# Patient Record
Sex: Male | Born: 1990
Health system: Southern US, Community
[De-identification: ages and names within clinical notes are randomized; demographics above are authoritative.]

## PROBLEM LIST (undated history)

## (undated) DIAGNOSIS — T7840XA Allergy, unspecified, initial encounter: Secondary | ICD-10-CM

## (undated) HISTORY — DX: Allergy, unspecified, initial encounter: T78.40XA

---
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
## 2012-01-12 IMAGING — CR DG CHEST 2V
1 series · 2 of 2 positions shown · non-contrast
Comparison: none

REASON FOR EXAM: FEVER, COUGH
COMMENTS:   May transport without cardiac monitor

[Series 1: view not recorded · 0.17mm/px · 2 of 2 slices shown]
[im 1/2]
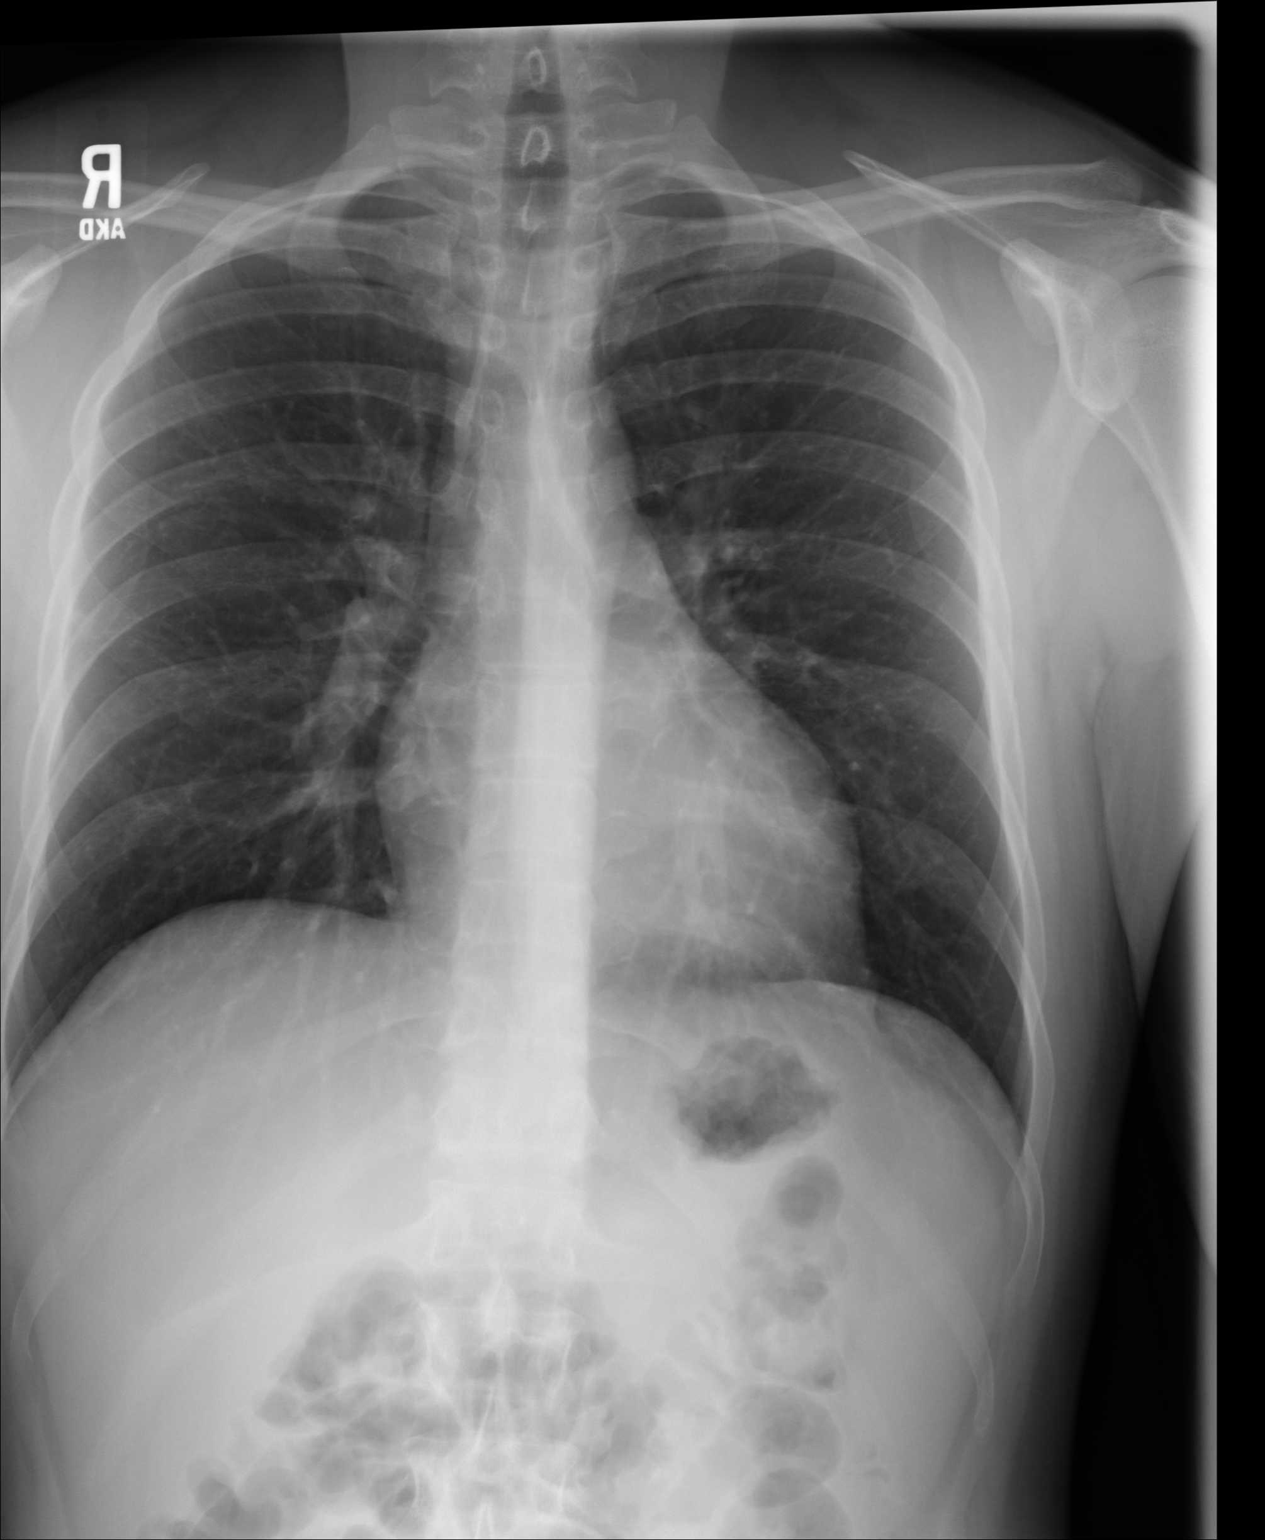
[im 2/2]
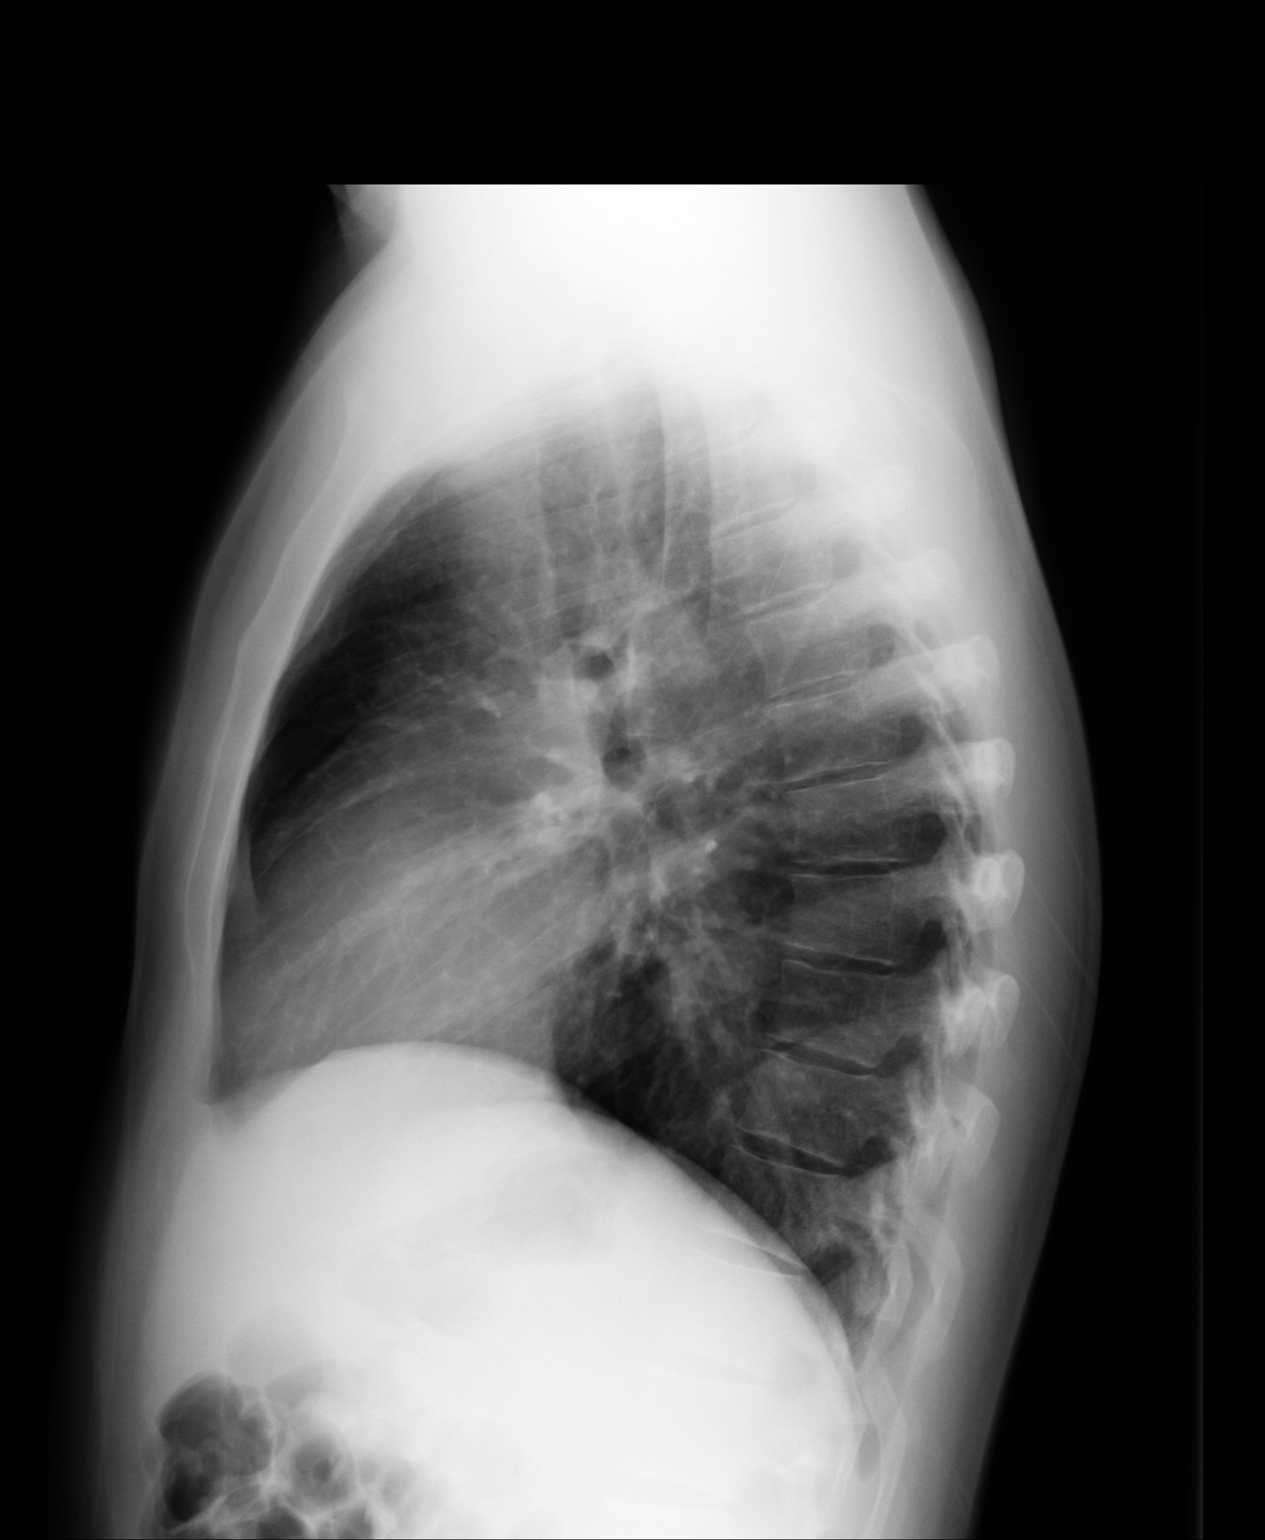

[2 of 2 positions shown; findings below may reference images not displayed]

PROCEDURE:     DXR - DXR CHEST PA (OR AP) AND LATERAL  - September 02, 2010  [DATE]

RESULT:     Comparison is made to the study 09 June, 2007.

The lungs are adequately inflated. There is no focal infiltrate. The cardiac
silhouette is normal in size. The pulmonary vascularity is not engorged.
There is no pleural effusion. The mediastinum is normal in width.
IMPRESSION: I do not see evidence of pneumonia nor other acute
cardiopulmonary abnormality.

## 2014-12-06 ENCOUNTER — Ambulatory Visit (INDEPENDENT_AMBULATORY_CARE_PROVIDER_SITE_OTHER): Payer: No Typology Code available for payment source | Admitting: Internal Medicine

## 2014-12-06 ENCOUNTER — Encounter: Payer: Self-pay | Admitting: Internal Medicine

## 2014-12-06 VITALS — BP 118/84 | HR 81 | Temp 98.8°F | Resp 18 | Ht 61.0 in | Wt 167.2 lb

## 2014-12-06 DIAGNOSIS — R0982 Postnasal drip: Secondary | ICD-10-CM

## 2014-12-06 DIAGNOSIS — J329 Chronic sinusitis, unspecified: Secondary | ICD-10-CM

## 2014-12-06 DIAGNOSIS — Z9109 Other allergy status, other than to drugs and biological substances: Secondary | ICD-10-CM

## 2014-12-06 DIAGNOSIS — R110 Nausea: Secondary | ICD-10-CM

## 2014-12-06 DIAGNOSIS — Z13220 Encounter for screening for lipoid disorders: Secondary | ICD-10-CM | POA: Diagnosis not present

## 2014-12-06 DIAGNOSIS — Z91048 Other nonmedicinal substance allergy status: Secondary | ICD-10-CM | POA: Diagnosis not present

## 2014-12-06 NOTE — Patient Instructions (Signed)
Continue the antihistamine daily.    nasacort nasal spray - two sprays each nostril one time per day.  Do this in the evening.    Saline nasal spray - flush nose at least 2-3x/day

## 2014-12-06 NOTE — Progress Notes (Signed)
Pre-visit discussion using our clinic review tool. No additional management support is needed unless otherwise documented below in the visit note.  

## 2014-12-06 NOTE — Progress Notes (Signed)
Patient ID: Derek Dillon  DOB: [birth year], 24 y.o.   MRN: [id number]   Subjective:    Patient ID: Derek Dillon, male    DOB: December 12, 1990, 24 y.o.   MRN: [id number]  HPI  Patient here to establish care.  He is accompanied by his mother.  History obtained from both of them.  He has a history of allergy issues.  Mother reports that he saw Dr Jenne Campus several years ago.  Was told to take antihistamine.  Did well with this for a while.  Over the last few months, symptoms have worsened.  Increased drainage.  Also reports runny nose.   No sinus pressure.  No chest congestion.  States the drainage is so bad that it causes some nausea and some intermittent vomiting.  No abdominal pain or cramping.  Some decreased appetite at times.  No reported weight loss.  No sob or wheezing.  No diarrhea.  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  No new exposures.     Past Medical History  Diagnosis Date  . Allergy    No past surgical history on file. Family History  Problem Relation Age of Onset  . Hypertension Mother   . Hypertension Father   . Hypertension Maternal Grandfather    Social History   Social History  . Marital Status: Single    Spouse Name: N/A  . Number of Children: N/A  . Years of Education: N/A   Social History Main Topics  . Smoking status: Never Smoker   . Smokeless tobacco: Never Used  . Alcohol Use: No  . Drug Use: No  . Sexual Activity: Not Asked   Other Topics Concern  . None   Social History Narrative    Outpatient Encounter Prescriptions as of 12/06/2014  Medication Sig  . Loratadine 10 MG CAPS Take 10 mg by mouth daily.  . Multiple Vitamin (MULTIVITAMIN) tablet Take 1 tablet by mouth daily.   No facility-administered encounter medications on file as of 12/06/2014.    Review of Systems  Constitutional: Negative for unexpected weight change.       Some intermittent decreased appetite.    HENT: Positive for postnasal drip. Negative for sinus pressure.        Increased  runny nose and increased and worsening drainage.    Eyes: Negative for pain and visual disturbance.  Respiratory: Negative for cough, chest tightness and shortness of breath.   Cardiovascular: Negative for chest pain, palpitations and leg swelling.  Gastrointestinal: Positive for nausea. Negative for vomiting, abdominal pain and diarrhea.  Genitourinary: Negative for dysuria and difficulty urinating.  Musculoskeletal: Negative for back pain and joint swelling.  Skin: Negative for color change and rash.  Neurological: Negative for dizziness, light-headedness and headaches.  Hematological: Negative for adenopathy. Does not bruise/bleed easily.  Psychiatric/Behavioral: Negative for dysphoric mood and agitation.       Objective:    Physical Exam  Constitutional: He appears well-developed and well-nourished. No distress.  HENT:  Nose: Nose normal.  Mouth/Throat: Oropharynx is clear and moist.  Eyes: Conjunctivae are normal. Right eye exhibits no discharge. Left eye exhibits no discharge.  Neck: Neck supple. No JVD present. No thyromegaly present.  Cardiovascular: Normal rate and regular rhythm.   Pulmonary/Chest: Effort normal and breath sounds normal. No respiratory distress.  Abdominal: Soft. Bowel sounds are normal. There is no tenderness.  Musculoskeletal: He exhibits no edema or tenderness.  Skin: No rash noted. No erythema.  Psychiatric: He has a normal mood and affect.  His behavior is normal.    BP 118/84 mmHg  Pulse 81  Temp(Src) 98.8 F (37.1 C) (Oral)  Resp 18  Ht 5\' 1"  (1.549 m)  Wt 167 lb 4 oz (75.864 kg)  BMI 31.62 kg/m2  SpO2 98% Wt Readings from Last 3 Encounters:  12/06/14 167 lb 4 oz (75.864 kg)       Assessment & Plan:   Problem List Items Addressed This Visit    Environmental allergies    Has seen Dr Jenne CampusMcQueen previously.  On antihistamine.  Add steroid nasal spray.  Follow closely.  Get him back in soon to reasses.        Relevant Orders   CBC with  Differential/Platelet   Nausea - Primary    He relates this to the drainage.  No other symptoms.  Treat with steroid nasal spray as outlined.  Continue antihistamine.  Check routine labs, including cbc and metabolic panel.  Follow closely.  Get him back in soon to reassess.       Relevant Orders   TSH   Hepatic function panel   Basic metabolic panel   Post-nasal drainage    Symptoms as outlined.  Add nasacort as outlined.  Continue daily antihistamine.  Get him back in soon to reassess.  If persistent problems, will need referral back to ENT.         Other Visit Diagnoses    Screening cholesterol level        Relevant Orders    Lipid panel        Dale DurhamSCOTT, Sy Saintjean, MD

## 2014-12-07 ENCOUNTER — Encounter: Payer: Self-pay | Admitting: Internal Medicine

## 2014-12-07 DIAGNOSIS — Z9109 Other allergy status, other than to drugs and biological substances: Secondary | ICD-10-CM | POA: Insufficient documentation

## 2014-12-07 DIAGNOSIS — R110 Nausea: Secondary | ICD-10-CM | POA: Insufficient documentation

## 2014-12-07 DIAGNOSIS — R0982 Postnasal drip: Secondary | ICD-10-CM | POA: Insufficient documentation

## 2014-12-07 NOTE — Assessment & Plan Note (Signed)
He relates this to the drainage.  No other symptoms.  Treat with steroid nasal spray as outlined.  Continue antihistamine.  Check routine labs, including cbc and metabolic panel.  Follow closely.  Get him back in soon to reassess.

## 2014-12-07 NOTE — Assessment & Plan Note (Signed)
Symptoms as outlined.  Add nasacort as outlined.  Continue daily antihistamine.  Get him back in soon to reassess.  If persistent problems, will need referral back to ENT.

## 2014-12-07 NOTE — Assessment & Plan Note (Signed)
Has seen Dr Jenne CampusMcQueen previously.  On antihistamine.  Add steroid nasal spray.  Follow closely.  Get him back in soon to reasses.

## 2015-01-17 ENCOUNTER — Other Ambulatory Visit (INDEPENDENT_AMBULATORY_CARE_PROVIDER_SITE_OTHER): Payer: No Typology Code available for payment source

## 2015-01-17 DIAGNOSIS — Z9109 Other allergy status, other than to drugs and biological substances: Secondary | ICD-10-CM

## 2015-01-17 DIAGNOSIS — R110 Nausea: Secondary | ICD-10-CM

## 2015-01-17 DIAGNOSIS — Z13220 Encounter for screening for lipoid disorders: Secondary | ICD-10-CM

## 2015-01-17 DIAGNOSIS — Z91048 Other nonmedicinal substance allergy status: Secondary | ICD-10-CM

## 2015-01-17 LAB — CBC WITH DIFFERENTIAL/PLATELET
BASOS ABS: 0 10*3/uL (ref 0.0–0.1)
BASOS PCT: 0.4 % (ref 0.0–3.0)
Eosinophils Absolute: 0.4 10*3/uL (ref 0.0–0.7)
Eosinophils Relative: 4.7 % (ref 0.0–5.0)
HEMATOCRIT: 44.2 % (ref 39.0–52.0)
Hemoglobin: 14.7 g/dL (ref 13.0–17.0)
LYMPHS PCT: 45.3 % (ref 12.0–46.0)
Lymphs Abs: 3.5 10*3/uL (ref 0.7–4.0)
MCHC: 33.2 g/dL (ref 30.0–36.0)
MCV: 85.1 fl (ref 78.0–100.0)
MONO ABS: 0.6 10*3/uL (ref 0.1–1.0)
Monocytes Relative: 8.4 % (ref 3.0–12.0)
NEUTROS ABS: 3.2 10*3/uL (ref 1.4–7.7)
NEUTROS PCT: 41.2 % — AB (ref 43.0–77.0)
PLATELETS: 322 10*3/uL (ref 150.0–400.0)
RBC: 5.19 Mil/uL (ref 4.22–5.81)
RDW: 14.2 % (ref 11.5–15.5)
WBC: 7.8 10*3/uL (ref 4.0–10.5)

## 2015-01-17 LAB — BASIC METABOLIC PANEL
BUN: 4 mg/dL — ABNORMAL LOW (ref 6–23)
CO2: 24 meq/L (ref 19–32)
Calcium: 10 mg/dL (ref 8.4–10.5)
Chloride: 106 mEq/L (ref 96–112)
Creatinine, Ser: 0.9 mg/dL (ref 0.40–1.50)
GFR: 110.51 mL/min (ref >60.00)
GLUCOSE: 104 mg/dL — AB (ref 70–99)
POTASSIUM: 3.5 meq/L (ref 3.5–5.1)
SODIUM: 141 meq/L (ref 135–145)

## 2015-01-17 LAB — HEPATIC FUNCTION PANEL
ALK PHOS: 136 U/L — AB (ref 39–117)
ALT: 27 U/L (ref 0–53)
AST: 28 U/L (ref 0–37)
Albumin: 4.4 g/dL (ref 3.5–5.2)
BILIRUBIN DIRECT: 0.1 mg/dL (ref 0.0–0.3)
BILIRUBIN TOTAL: 0.6 mg/dL (ref 0.2–1.2)
TOTAL PROTEIN: 8 g/dL (ref 6.0–8.3)

## 2015-01-17 LAB — LIPID PANEL
CHOLESTEROL: 157 mg/dL (ref 0–200)
HDL: 35.9 mg/dL — ABNORMAL LOW (ref >39.00)
LDL CALC: 84 mg/dL (ref 0–99)
NonHDL: 120.62
TRIGLYCERIDES: 183 mg/dL — AB (ref 0.0–149.0)
Total CHOL/HDL Ratio: 4
VLDL: 36.6 mg/dL (ref 0.0–40.0)

## 2015-01-17 LAB — TSH: TSH: 1.51 u[IU]/mL (ref 0.35–4.50)

## 2015-01-18 ENCOUNTER — Encounter: Payer: Self-pay | Admitting: Internal Medicine

## 2015-01-21 ENCOUNTER — Encounter: Payer: Self-pay | Admitting: Internal Medicine

## 2015-01-21 ENCOUNTER — Ambulatory Visit (INDEPENDENT_AMBULATORY_CARE_PROVIDER_SITE_OTHER): Payer: No Typology Code available for payment source | Admitting: Internal Medicine

## 2015-01-21 VITALS — BP 130/90 | HR 55 | Temp 98.3°F | Resp 18 | Ht 61.0 in | Wt 160.8 lb

## 2015-01-21 DIAGNOSIS — R030 Elevated blood-pressure reading, without diagnosis of hypertension: Secondary | ICD-10-CM

## 2015-01-21 DIAGNOSIS — J329 Chronic sinusitis, unspecified: Secondary | ICD-10-CM | POA: Diagnosis not present

## 2015-01-21 DIAGNOSIS — Z91048 Other nonmedicinal substance allergy status: Secondary | ICD-10-CM | POA: Diagnosis not present

## 2015-01-21 DIAGNOSIS — R0982 Postnasal drip: Secondary | ICD-10-CM

## 2015-01-21 DIAGNOSIS — IMO0001 Reserved for inherently not codable concepts without codable children: Secondary | ICD-10-CM

## 2015-01-21 DIAGNOSIS — R110 Nausea: Secondary | ICD-10-CM

## 2015-01-21 DIAGNOSIS — Z9109 Other allergy status, other than to drugs and biological substances: Secondary | ICD-10-CM

## 2015-01-21 DIAGNOSIS — R748 Abnormal levels of other serum enzymes: Secondary | ICD-10-CM

## 2015-01-21 NOTE — Progress Notes (Signed)
Patient ID: Derek Dillon, male   DOB: 06/21/1990, 24 y.o.   MRN: [id number]   Subjective:    Patient ID: Derek Dillon, male    DOB: 09/21/1990, 24 y.o.   MRN: [id number]  HPI  Patient with past history of allergies.  He comes in today for a scheduled follow up.  He is accompanied by his mother.  History obtained from both of them.  He is doing better.  Nasal congestion and drainage - better.  The vomiting has significantly decreased.  Only a couple of episodes since his last visit.  He feels better.  Eating better.  No nausea and no significant vomiting.  Bowels stable.     Past Medical History  Diagnosis Date  . Allergy    No past surgical history on file. Family History  Problem Relation Age of Onset  . Hypertension Mother   . Hypertension Father   . Hypertension Maternal Grandfather    Social History   Social History  . Marital Status: Single    Spouse Name: N/A  . Number of Children: N/A  . Years of Education: N/A   Social History Main Topics  . Smoking status: Never Smoker   . Smokeless tobacco: Never Used  . Alcohol Use: No  . Drug Use: No  . Sexual Activity: Not Asked   Other Topics Concern  . None   Social History Narrative    Outpatient Encounter Prescriptions as of 01/21/2015  Medication Sig  . Multiple Vitamin (MULTIVITAMIN) tablet Take 1 tablet by mouth daily.  . Triamcinolone Acetonide (NASACORT AQ NA) Place into the nose.  . [DISCONTINUED] Loratadine 10 MG CAPS Take 10 mg by mouth daily.   No facility-administered encounter medications on file as of 01/21/2015.    Review of Systems  Constitutional: Negative for unexpected weight change.       Appetite is better.  Eating better.    HENT: Negative for sinus pressure.        Allergy symptoms improved.   Using nasacort.  Discussed saline nasal spray.    Eyes: Negative for pain and discharge.  Respiratory: Negative for cough, chest tightness and shortness of breath.   Cardiovascular: Negative for  chest pain, palpitations and leg swelling.  Gastrointestinal: Negative for nausea, vomiting, abdominal pain and diarrhea.       Vomiting has significantly improved.    Genitourinary: Negative for dysuria and difficulty urinating.  Musculoskeletal: Negative for back pain and joint swelling.  Skin: Negative for color change and rash.  Neurological: Negative for dizziness, light-headedness and headaches.  Psychiatric/Behavioral: Negative for dysphoric mood and agitation.       Objective:     Blood pressure rechecked by me:  124/86  Physical Exam  Constitutional: He appears well-developed and well-nourished. No distress.  HENT:  Nose: Nose normal.  Mouth/Throat: Oropharynx is clear and moist.  Eyes: Conjunctivae are normal. Right eye exhibits no discharge. Left eye exhibits no discharge.  Neck: Neck supple. No thyromegaly present.  Cardiovascular: Normal rate and regular rhythm.   Pulmonary/Chest: Effort normal and breath sounds normal. No respiratory distress.  Abdominal: Soft. Bowel sounds are normal. There is no tenderness.  Musculoskeletal: He exhibits no edema or tenderness.  Lymphadenopathy:    He has no cervical adenopathy.  Skin: No rash noted. No erythema.  Psychiatric: He has a normal mood and affect. His behavior is normal.    BP 130/90 mmHg  Pulse 55  Temp(Src) 98.3 F (36.8 C) (Oral)  Resp 18  Ht  (1.549 m)  Wt 160 lb 12 oz (72.916 kg)  BMI 30.39 kg/m2  SpO2 96% Wt Readings from Last 3 Encounters:  01/21/15 160 lb 12 oz (72.916 kg)  12/06/14 167 lb 4 oz (75.864 kg)     Lab Results  Component Value Date   WBC 7.8 01/17/2015   HGB 14.7 01/17/2015   HCT 44.2 01/17/2015   PLT 322.0 01/17/2015   GLUCOSE 104* 01/17/2015   CHOL 157 01/17/2015   TRIG 183.0* 01/17/2015   HDL 35.90* 01/17/2015   LDLCALC 84 01/17/2015   ALT 27 01/17/2015   AST 28 01/17/2015   NA 141 01/17/2015   K 3.5 01/17/2015   CL 106 01/17/2015   CREATININE 0.90 01/17/2015   BUN  4* 01/17/2015   CO2 24 01/17/2015   TSH 1.51 01/17/2015       Assessment & Plan:   Problem List Items Addressed This Visit    Blood pressure elevated    On recheck better.  Have him spot check his pressure.  Follow.        Elevated alkaline phosphatase level    Slightly increased.  Recheck liver panel in a few weeks.        Relevant Orders   Hepatic function panel   Environmental allergies    Allergies better on current medication regimen.  Follow.        Nausea    Had felt this was coming from increased drainage.  Allergy symptoms have improved.  Continue current medication regimen.  Follow.        Post-nasal drainage - Primary    Symptoms have improved.  Continue nasacort.  Saline nasal spray as directed.  Follow.  Hold on additional evaluation and w/up given significant improvement.            Dale Lockport, MD

## 2015-01-21 NOTE — Progress Notes (Signed)
Pre-visit discussion using our clinic review tool. No additional management support is needed unless otherwise documented below in the visit note.  

## 2015-01-21 NOTE — Telephone Encounter (Signed)
Mailed unread mychart message

## 2015-01-23 ENCOUNTER — Encounter: Payer: Self-pay | Admitting: Internal Medicine

## 2015-01-23 DIAGNOSIS — R030 Elevated blood-pressure reading, without diagnosis of hypertension: Secondary | ICD-10-CM | POA: Insufficient documentation

## 2015-01-23 DIAGNOSIS — R748 Abnormal levels of other serum enzymes: Secondary | ICD-10-CM | POA: Insufficient documentation

## 2015-01-23 DIAGNOSIS — IMO0001 Reserved for inherently not codable concepts without codable children: Secondary | ICD-10-CM | POA: Insufficient documentation

## 2015-01-23 NOTE — Assessment & Plan Note (Signed)
Symptoms have improved.  Continue nasacort.  Saline nasal spray as directed.  Follow.  Hold on additional evaluation and w/up given significant improvement.

## 2015-01-23 NOTE — Assessment & Plan Note (Signed)
Had felt this was coming from increased drainage.  Allergy symptoms have improved.  Continue current medication regimen.  Follow.

## 2015-01-23 NOTE — Assessment & Plan Note (Signed)
On recheck better.  Have him spot check his pressure.  Follow.

## 2015-01-23 NOTE — Assessment & Plan Note (Signed)
Allergies better on current medication regimen.  Follow.

## 2015-01-23 NOTE — Assessment & Plan Note (Signed)
Slightly increased.  Recheck liver panel in a few weeks.

## 2015-02-18 ENCOUNTER — Other Ambulatory Visit (INDEPENDENT_AMBULATORY_CARE_PROVIDER_SITE_OTHER): Payer: No Typology Code available for payment source

## 2015-02-18 DIAGNOSIS — R748 Abnormal levels of other serum enzymes: Secondary | ICD-10-CM

## 2015-02-18 LAB — HEPATIC FUNCTION PANEL
ALT: 21 U/L (ref 0–53)
AST: 22 U/L (ref 0–37)
Albumin: 4.9 g/dL (ref 3.5–5.2)
Alkaline Phosphatase: 164 U/L — ABNORMAL HIGH (ref 39–117)
BILIRUBIN DIRECT: 0.1 mg/dL (ref 0.0–0.3)
BILIRUBIN TOTAL: 0.4 mg/dL (ref 0.2–1.2)
Total Protein: 8.6 g/dL — ABNORMAL HIGH (ref 6.0–8.3)

## 2015-02-21 ENCOUNTER — Other Ambulatory Visit: Payer: Self-pay | Admitting: Internal Medicine

## 2015-02-21 DIAGNOSIS — R748 Abnormal levels of other serum enzymes: Secondary | ICD-10-CM

## 2015-02-21 NOTE — Progress Notes (Signed)
Order placed for f/u labs.  

## 2015-02-22 ENCOUNTER — Encounter: Payer: Self-pay | Admitting: *Deleted

## 2015-02-22 DIAGNOSIS — R748 Abnormal levels of other serum enzymes: Secondary | ICD-10-CM

## 2015-02-24 NOTE — Telephone Encounter (Signed)
Unread mychart message mailed to patient 

## 2015-02-24 NOTE — Telephone Encounter (Signed)
Order placed for abdominal ultrasound - to be done at [facility] on [address]

## 2015-02-24 NOTE — Addendum Note (Signed)
Addended by: Charm BargesSCOTT, Marnesha Gagen S on: 02/24/2015 01:26 PM   Modules accepted: Orders

## 2015-03-02 ENCOUNTER — Ambulatory Visit: Payer: No Typology Code available for payment source

## 2015-03-15 ENCOUNTER — Other Ambulatory Visit: Payer: No Typology Code available for payment source

## 2015-03-20 ENCOUNTER — Encounter: Payer: Self-pay | Admitting: Internal Medicine

## 2015-04-22 ENCOUNTER — Ambulatory Visit: Payer: No Typology Code available for payment source | Admitting: Internal Medicine

## 2017-04-11 ENCOUNTER — Ambulatory Visit: Payer: BLUE CROSS/BLUE SHIELD | Admitting: Family Medicine

## 2017-04-11 ENCOUNTER — Encounter: Payer: Self-pay | Admitting: Family Medicine

## 2017-04-11 VITALS — BP 128/88 | HR 77 | Temp 98.9°F | Resp 15 | Wt 173.1 lb

## 2017-04-11 DIAGNOSIS — J069 Acute upper respiratory infection, unspecified: Secondary | ICD-10-CM

## 2017-04-11 DIAGNOSIS — H1033 Unspecified acute conjunctivitis, bilateral: Secondary | ICD-10-CM

## 2017-04-11 DIAGNOSIS — R030 Elevated blood-pressure reading, without diagnosis of hypertension: Secondary | ICD-10-CM | POA: Diagnosis not present

## 2017-04-11 MED ORDER — POLYMYXIN B-TRIMETHOPRIM 10000-0.1 UNIT/ML-% OP SOLN: 1.0000 [drp] | OPHTHALMIC | 0 refills | Status: DC

## 2017-04-11 NOTE — Progress Notes (Signed)
Patient ID: Derek Dillon, male   DOB: 02-17-1991, 27 y.o.   MRN: [id number]  PCP: Derek Belmar, MD  Subjective:  Derek Dillon is a 27 y.o. year old very pleasant male patient who presents with Upper Respiratory infection symptoms including nasal congestion, sore throat has improved , cough that is nonproductive. Fever 5 days ago he experienced a fever with Tmax 101F reported by mom. Erythema in right eye present first 5 days ago then erythema in left eye two days ago. Crusting noted bilaterally in the morning. No visual changes. Denies pain. -started: one week ago  Symptoms are improving  -previous treatments: Refresh eye drops used with limited benefit. Mucinex has provided limited benefit.  -sick contacts/travel/risks: denies flu exposure. Recent sick contact exposure with Mom. No flu exposure -Hx of: allergies (he has used Nasocort, bee pollen, and has been evaluated by ENT who determined that symptoms were seasonal allergies)  ROS-denies SOB, NVD, tooth pain  Pertinent Past Medical History- Elevated BP history   Medications- reviewed  Current Outpatient Medications  Medication Sig Dispense Refill  . Multiple Vitamin (MULTIVITAMIN) tablet Take 1 tablet by mouth daily.    . Triamcinolone Acetonide (NASACORT AQ NA) Place into the nose.     No current facility-administered medications for this visit.     Objective: BP (!) 128/100 (BP Location: Right Arm, Patient Position: Sitting, Cuff Size: Large)   Pulse 77   Temp 98.9 F (37.2 C) (Oral)   Resp 15   Wt 173 lb 2 oz (78.5 kg)   SpO2 98%   BMI 32.71 kg/m  Gen: NAD, resting comfortably HEENT: Turbinates erythematous, TMs normal bilaterally, pharynx mildly erythematous with no tonsilar exudate or edema, no sinus tenderness. Bilateral eyes:  Diffuse injection of the conjunctiva. No white spots, opacity, or foreign body appreciated. No collection of blood or pus in the anterior chamber. No ciliary flush surrounding iris.  No  photophobia or eye pain appreciated during exam. No drainage noted. Minimal crusting on lower eyelids present. CV: RRR no murmurs rubs or gallops Lungs: CTAB no crackles, wheeze, rhonchi Abdomen: soft/nontender/nondistended/normal bowel sounds. No rebound or guarding.  Ext: no edema Skin: warm, dry, no rash Neuro: grossly normal, moves all extremities  Assessment/Plan:  1. Upper Respiratory infection History and exam today are suggestive of viral infection most likely due to upper respiratory infection that is resolving. History of allergies noted. Symptomatic treatment with: Get rest, drink plenty of fluids, and use tylenol or ibuprofen as needed for discomfort. Follow up if fever >101, if symptoms worsen or if symptoms are not improved in 3 to 4 days. Patient verbalizes understanding.   2. Acute conjunctivitis of both eyes, unspecified acute conjunctivitis type Exam is reassuring; suspect symptoms are either viral or allergic in nature. We discussed allergic, viral, and bacterial etiology. We further discussed options for treatment and opted to provide treatment today with trimethoprim-polymyxin b for symptoms that do not improve due to history of symptoms starting in right eye then moving to the left. Reviewed care when cleaning eyes with patient and mom.   We discussed that we did not find any infection that had higher probability of being bacterial such as pneumonia or strep throat. We discussed signs that bacterial infection may have developed particularly fever or shortness of breath. Likely course of 1-2 weeks. Patient is contagious and advised good handwashing and consideration of mask If going to be in public places.   3. Elevated blood pressure reading without diagnosis of hypertension  Retake of BP 128/88. Patient stated that he was nervous and it is uncomfortable to have his BP checked. With recheck normal, suspect that first reading was altered by movement and anxiety from patient.  Also, first reading may have been elevated due to improper cuff size.  Finally, we reviewed reasons to return to care including if symptoms worsen or persist or new concerns arise- once again particularly shortness of breath or fever.  Derek CatalinaJulia Ann Emalea Mix, FNP

## 2017-04-11 NOTE — Patient Instructions (Signed)
Please add Allegra or Claritin once daily for symptoms. Also, use drops as directed and follow up if symptoms do not improve with treatment, worsen, or you develop new symptoms.   Allergic Conjunctivitis A clear membrane (conjunctiva) covers the white part of your eye and the inner surface of your eyelid. Allergic conjunctivitis happens when this membrane has inflammation. This is caused by allergies. Common causes of allergic reactions (allergens)include:  Outdoor allergens, such as: ? Pollen. ? Grass and weeds. ? Mold spores.  Indoor allergens, such as: ? Dust. ? Smoke. ? Mold. ? Pet dander. ? Animal hair.  This condition can make your eye red or pink. It can also make your eye feel itchy. This condition cannot be spread from one person to another person (is not contagious). Follow these instructions at home:  Try not to be around things that you are allergic to.  Take or apply over-the-counter and prescription medicines only as told by your doctor. These include any eye drops.  Place a cool, clean washcloth on your eye for 10-20 minutes. Do this 3-4 times a day.  Do not touch or rub your eyes.  Do not wear contact lenses until the inflammation is gone. Wear glasses instead.  Do not wear eye makeup until the inflammation is gone.  Keep all follow-up visits as told by your doctor. This is important. Contact a doctor if:  Your symptoms get worse.  Your symptoms do not get better with treatment.  You have mild eye pain.  You are sensitive to light,  You have spots or blisters on your eyes.  You have pus coming from your eye.  You have a fever. Get help right away if:  You have redness, swelling, or other symptoms in only one eye.  Your vision is blurry.  You have vision changes.  You have very bad eye pain. Summary  Allergic conjunctivitis is caused by allergies. It can make your eye red or pink, and it can make your eye feel itchy.  This condition cannot  be spread from one person to another person (is not contagious).  Try not to be around things that you are allergic to.  Take or apply over-the-counter and prescription medicines only as told by your doctor. These include any eye drops.  Contact your doctor if your symptoms get worse or they do not get better with treatment. This information is not intended to replace advice given to you by your health care provider. Make sure you discuss any questions you have with your health care provider. Document Released: 07/26/2009 Document Revised: 09/30/2015 Document Reviewed: 09/30/2015 Elsevier Interactive Patient Education  2017 [employer]  Upper Respiratory Infection, Adult Most upper respiratory infections (URIs) are caused by a virus. A URI affects the nose, throat, and upper air passages. The most common type of URI is often called "the common cold." Follow these instructions at home:  Take medicines only as told by your doctor.  Gargle warm saltwater or take cough drops to comfort your throat as told by your doctor.  Use a warm mist humidifier or inhale steam from a shower to increase air moisture. This may make it easier to breathe.  Drink enough fluid to keep your pee (urine) clear or pale yellow.  Eat soups and other clear broths.  Have a healthy diet.  Rest as needed.  Go back to work when your fever is gone or your doctor says it is okay. ? You may need to stay home longer to  avoid giving your URI to others. ? You can also wear a face mask and wash your hands often to prevent spread of the virus.  Use your inhaler more if you have asthma.  Do not use any tobacco products, including cigarettes, chewing tobacco, or electronic cigarettes. If you need help quitting, ask your doctor. Contact a doctor if:  You are getting worse, not better.  Your symptoms are not helped by medicine.  You have chills.  You are getting more short of breath.  You have brown or red  mucus.  You have yellow or brown discharge from your nose.  You have pain in your face, especially when you bend forward.  You have a fever.  You have puffy (swollen) neck glands.  You have pain while swallowing.  You have white areas in the back of your throat. Get help right away if:  You have very bad or constant: ? Headache. ? Ear pain. ? Pain in your forehead, behind your eyes, and over your cheekbones (sinus pain). ? Chest pain.  You have long-lasting (chronic) lung disease and any of the following: ? Wheezing. ? Long-lasting cough. ? Coughing up blood. ? A change in your usual mucus.  You have a stiff neck.  You have changes in your: ? Vision. ? Hearing. ? Thinking. ? Mood. This information is not intended to replace advice given to you by your health care provider. Make sure you discuss any questions you have with your health care provider. Document Released: 07/25/2007 Document Revised: 10/09/2015 Document Reviewed: 05/13/2013 Elsevier Interactive Patient Education  2018 [employer]

## 2019-11-30 ENCOUNTER — Encounter: Payer: Self-pay | Admitting: Internal Medicine

## 2020-03-21 ENCOUNTER — Encounter: Payer: Self-pay | Admitting: *Deleted

## 2022-10-02 NOTE — Telephone Encounter (Signed)
Pt mom called in asking to make an appt with provider. However, pt saw Dr. Lorin Picket back in 2017 last time. Is Dr. Lorin Picket willing to continue on his health care needs? Please advice

## 2022-10-04 NOTE — Telephone Encounter (Signed)
Ok to schedule.

## 2022-10-04 NOTE — Telephone Encounter (Signed)
Ok

## 2022-10-05 NOTE — Telephone Encounter (Signed)
Okay to schedule patient. 

## 2022-10-05 NOTE — Telephone Encounter (Signed)
Noted  

## 2022-12-10 ENCOUNTER — Ambulatory Visit: Payer: BLUE CROSS/BLUE SHIELD | Admitting: Internal Medicine

## 2023-01-08 ENCOUNTER — Encounter: Payer: Self-pay | Admitting: Internal Medicine

## 2023-01-08 ENCOUNTER — Ambulatory Visit (INDEPENDENT_AMBULATORY_CARE_PROVIDER_SITE_OTHER): Payer: PRIVATE HEALTH INSURANCE | Admitting: Internal Medicine

## 2023-01-08 VITALS — BP 138/94 | HR 84 | Temp 98.0°F | Resp 16 | Ht 62.0 in | Wt 164.0 lb

## 2023-01-08 DIAGNOSIS — R110 Nausea: Secondary | ICD-10-CM

## 2023-01-08 DIAGNOSIS — Z9109 Other allergy status, other than to drugs and biological substances: Secondary | ICD-10-CM | POA: Diagnosis not present

## 2023-01-08 DIAGNOSIS — R030 Elevated blood-pressure reading, without diagnosis of hypertension: Secondary | ICD-10-CM

## 2023-01-08 NOTE — Progress Notes (Signed)
Subjective:    Patient ID: Derek Dillon, male    DOB: 11-22-1990, 32 y.o.   MRN: [id number]  Patient here for  Chief Complaint  Patient presents with   Establish Care    HPI Here to reestablish care. He is accompanied by his mother.  History obtained from both of them.  He is doing well.  Working at [employer].  Tries to stay active.  No chest pain or sob reported.  No increased cough.  Some allergies at times.  No abdominal pain or bowel change reported.  Discussed blood pressure.     Past Medical History:  Diagnosis Date   Allergy    History reviewed. No pertinent surgical history. Family History  Problem Relation Age of Onset   Hypertension Mother    Hypertension Father    Hypertension Maternal Grandfather    Social History   Socioeconomic History   Marital status: Single    Spouse name: Not on file   Number of children: Not on file   Years of education: Not on file   Highest education level: Not on file  Occupational History   Not on file  Tobacco Use   Smoking status: Never   Smokeless tobacco: Never  Substance and Sexual Activity   Alcohol use: No    Alcohol/week: 0.0 standard drinks of alcohol   Drug use: No   Sexual activity: Not on file  Other Topics Concern   Not on file  Social History Narrative   Not on file   Social Determinants of Health   Financial Resource Strain: Not on file  Food Insecurity: Not on file  Transportation Needs: Not on file  Physical Activity: Not on file  Stress: Not on file  Social Connections: Not on file     Review of Systems  Constitutional:  Negative for appetite change and unexpected weight change.  HENT:  Negative for congestion and sinus pressure.   Respiratory:  Negative for cough, chest tightness and shortness of breath.   Cardiovascular:  Negative for chest pain and palpitations.  Gastrointestinal:  Negative for abdominal pain, diarrhea, nausea and vomiting.  Genitourinary:  Negative for difficulty  urinating and dysuria.  Musculoskeletal:  Negative for joint swelling and myalgias.  Skin:  Negative for color change and rash.  Neurological:  Negative for dizziness and headaches.  Psychiatric/Behavioral:  Negative for agitation and dysphoric mood.        Objective:     BP (!) 138/94   Pulse 84   Temp 98 F (36.7 C)   Resp 16   Ht 5\' 2"  (1.575 m)   Wt 164 lb (74.4 kg)   SpO2 98%   BMI 30.00 kg/m  Wt Readings from Last 3 Encounters:  01/08/23 164 lb (74.4 kg)  04/11/17 173 lb 2 oz (78.5 kg)  01/21/15 160 lb 12 oz (72.9 kg)    Physical Exam Vitals reviewed.  Constitutional:      General: He is not in acute distress.    Appearance: Normal appearance. He is well-developed.  HENT:     Head: Normocephalic and atraumatic.     Right Ear: External ear normal.     Left Ear: External ear normal.  Eyes:     General: No scleral icterus.       Right eye: No discharge.        Left eye: No discharge.     Conjunctiva/sclera: Conjunctivae normal.  Cardiovascular:     Rate and Rhythm: Normal rate and  regular rhythm.  Pulmonary:     Effort: Pulmonary effort is normal. No respiratory distress.     Breath sounds: Normal breath sounds.  Abdominal:     General: Bowel sounds are normal.     Palpations: Abdomen is soft.     Tenderness: There is no abdominal tenderness.  Musculoskeletal:        General: No swelling or tenderness.     Cervical back: Neck supple. No tenderness.  Lymphadenopathy:     Cervical: No cervical adenopathy.  Skin:    Findings: No erythema or rash.  Neurological:     Mental Status: He is alert.  Psychiatric:        Mood and Affect: Mood normal.        Behavior: Behavior normal.      Outpatient Encounter Medications as of 01/08/2023  Medication Sig   Multiple Vitamin (MULTIVITAMIN) tablet Take 1 tablet by mouth daily.   Triamcinolone Acetonide (NASACORT AQ NA) Place into the nose.   trimethoprim-polymyxin b (POLYTRIM) ophthalmic solution Place 1 drop  into both eyes every 4 (four) hours.   No facility-administered encounter medications on file as of 01/08/2023.     Lab Results  Component Value Date   WBC 7.8 01/17/2015   HGB 14.7 01/17/2015   HCT 44.2 01/17/2015   PLT 322.0 01/17/2015   GLUCOSE 104 (H) 01/17/2015   CHOL 157 01/17/2015   TRIG 183.0 (H) 01/17/2015   HDL 35.90 (L) 01/17/2015   LDLCALC 84 01/17/2015   ALT 21 02/18/2015   AST 22 02/18/2015   NA 141 01/17/2015   K 3.5 01/17/2015   CL 106 01/17/2015   CREATININE 0.90 01/17/2015   BUN 4 (L) 01/17/2015   CO2 24 01/17/2015   TSH 1.51 01/17/2015       Assessment & Plan:  Environmental allergies Assessment & Plan: Not a significant issue for him now. Follow.     Elevated blood pressure reading Assessment & Plan: Blood pressure elevated today.  Discussed elevation.  Discussed need for labs.  Wants to return for labs.  Will spot check pressure. Call with readings. Discussed possible need for medication.   Orders: -     CBC with Differential/Platelet; Future -     Hepatic function panel; Future -     Basic metabolic panel; Future -     Lipid panel; Future -     TSH; Future  Nausea Assessment & Plan: Previously felt related to drainage. Resolved now.        Derek Wheeler, MD

## 2023-01-13 ENCOUNTER — Encounter: Payer: Self-pay | Admitting: Internal Medicine

## 2023-01-13 NOTE — Assessment & Plan Note (Signed)
Not a significant issue for him now.  Follow.

## 2023-01-13 NOTE — Assessment & Plan Note (Signed)
Blood pressure elevated today.  Discussed elevation.  Discussed need for labs.  Wants to return for labs.  Will spot check pressure. Call with readings. Discussed possible need for medication.

## 2023-01-13 NOTE — Assessment & Plan Note (Signed)
Previously felt related to drainage. Resolved now.

## 2023-01-24 ENCOUNTER — Other Ambulatory Visit (INDEPENDENT_AMBULATORY_CARE_PROVIDER_SITE_OTHER): Payer: PRIVATE HEALTH INSURANCE

## 2023-01-24 DIAGNOSIS — R030 Elevated blood-pressure reading, without diagnosis of hypertension: Secondary | ICD-10-CM | POA: Diagnosis not present

## 2023-01-24 LAB — HEPATIC FUNCTION PANEL
ALT: 29 U/L (ref 0–53)
AST: 26 U/L (ref 0–37)
Albumin: 4.5 g/dL (ref 3.5–5.2)
Alkaline Phosphatase: 106 U/L (ref 39–117)
Bilirubin, Direct: 0.1 mg/dL (ref 0.0–0.3)
Total Bilirubin: 0.6 mg/dL (ref 0.2–1.2)
Total Protein: 7.8 g/dL (ref 6.0–8.3)

## 2023-01-24 LAB — CBC WITH DIFFERENTIAL/PLATELET
Basophils Absolute: 0 10*3/uL (ref 0.0–0.1)
Basophils Relative: 0.4 % (ref 0.0–3.0)
Eosinophils Absolute: 0.6 10*3/uL (ref 0.0–0.7)
Eosinophils Relative: 9.7 % — ABNORMAL HIGH (ref 0.0–5.0)
HCT: 45.4 % (ref 39.0–52.0)
Hemoglobin: 15.1 g/dL (ref 13.0–17.0)
Lymphocytes Relative: 41.7 % (ref 12.0–46.0)
Lymphs Abs: 2.6 10*3/uL (ref 0.7–4.0)
MCHC: 33.4 g/dL (ref 30.0–36.0)
MCV: 88.3 fL (ref 78.0–100.0)
Monocytes Absolute: 0.6 10*3/uL (ref 0.1–1.0)
Monocytes Relative: 10.1 % (ref 3.0–12.0)
Neutro Abs: 2.4 10*3/uL (ref 1.4–7.7)
Neutrophils Relative %: 38.1 % — ABNORMAL LOW (ref 43.0–77.0)
Platelets: 280 10*3/uL (ref 150.0–400.0)
RBC: 5.14 Mil/uL (ref 4.22–5.81)
RDW: 13.4 % (ref 11.5–15.5)
WBC: 6.2 10*3/uL (ref 4.0–10.5)

## 2023-01-24 LAB — TSH: TSH: 1.44 u[IU]/mL (ref 0.35–5.50)

## 2023-01-24 LAB — BASIC METABOLIC PANEL
BUN: 7 mg/dL (ref 6–23)
CO2: 28 meq/L (ref 19–32)
Calcium: 9.5 mg/dL (ref 8.4–10.5)
Chloride: 106 meq/L (ref 96–112)
Creatinine, Ser: 0.89 mg/dL (ref 0.40–1.50)
GFR: 114.05 mL/min (ref >60.00)
Glucose, Bld: 103 mg/dL — ABNORMAL HIGH (ref 70–99)
Potassium: 3.6 meq/L (ref 3.5–5.1)
Sodium: 141 meq/L (ref 135–145)

## 2023-01-24 LAB — LIPID PANEL
Cholesterol: 192 mg/dL (ref 0–200)
HDL: 47.3 mg/dL (ref >39.00)
LDL Cholesterol: 113 mg/dL — ABNORMAL HIGH (ref 0–99)
NonHDL: 144.7
Total CHOL/HDL Ratio: 4
Triglycerides: 160 mg/dL — ABNORMAL HIGH (ref 0.0–149.0)
VLDL: 32 mg/dL (ref 0.0–40.0)

## 2023-01-25 ENCOUNTER — Encounter: Payer: Self-pay | Admitting: *Deleted

## 2023-03-21 ENCOUNTER — Ambulatory Visit (INDEPENDENT_AMBULATORY_CARE_PROVIDER_SITE_OTHER): Payer: PRIVATE HEALTH INSURANCE | Admitting: Internal Medicine

## 2023-03-21 VITALS — BP 130/90 | HR 88 | Temp 98.2°F | Resp 16 | Ht 62.0 in | Wt 171.8 lb

## 2023-03-21 DIAGNOSIS — I10 Essential (primary) hypertension: Secondary | ICD-10-CM | POA: Diagnosis not present

## 2023-03-21 DIAGNOSIS — Z136 Encounter for screening for cardiovascular disorders: Secondary | ICD-10-CM | POA: Diagnosis not present

## 2023-03-21 DIAGNOSIS — E7800 Pure hypercholesterolemia, unspecified: Secondary | ICD-10-CM | POA: Diagnosis not present

## 2023-03-21 MED ORDER — VALSARTAN 80 MG PO TABS: 80.0000 mg | ORAL_TABLET | Freq: Every day | ORAL | 1 refills | Status: DC

## 2023-03-21 NOTE — Progress Notes (Signed)
Subjective:    Patient ID: Derek Dillon, male    DOB: [birth year], 32 y.o.   MRN: [id number]  Patient here for  Chief Complaint  Patient presents with   Medical Management of Chronic Issues    HPI Here for a scheduled follow up - follow up regarding his blood pressure. He is accompanied by his mother. History obtained from both of them. Reports he is doing relatively well. Working. Denies chest pain or sob. Blood pressure remaining elevated - 140/90s and 149/103. No abdominal pain or bowel issues reported. Strong family history of early heart disease. Discussed screening CT calcium score.    Past Medical History:  Diagnosis Date   Allergy    History reviewed. No pertinent surgical history. Family History  Problem Relation Age of Onset   Hypertension Mother    Hypertension Father    Hypertension Maternal Grandfather    Social History   Socioeconomic History   Marital status: Single    Spouse name: Not on file   Number of children: Not on file   Years of education: Not on file   Highest education level: 12th grade  Occupational History   Not on file  Tobacco Use   Smoking status: Never   Smokeless tobacco: Never  Substance and Sexual Activity   Alcohol use: No    Alcohol/week: 0.0 standard drinks of alcohol   Drug use: No   Sexual activity: Not on file  Other Topics Concern   Not on file  Social History Narrative   Not on file   Social Drivers of Health   Financial Resource Strain: Low Risk  (03/21/2023)   Overall Financial Resource Strain (CARDIA)    Difficulty of Paying Living Expenses: Not hard at all  Food Insecurity: No Food Insecurity (03/21/2023)   Hunger Vital Sign    Worried About Running Out of Food in the Last Year: Never true    Ran Out of Food in the Last Year: Never true  Transportation Needs: No Transportation Needs (03/21/2023)   PRAPARE - Administrator, Civil Service (Medical): No    Lack of Transportation (Non-Medical): No   Physical Activity: Unknown (03/21/2023)   Exercise Vital Sign    Days of Exercise per Week: 0 days    Minutes of Exercise per Session: Not on file  Stress: No Stress Concern Present (03/21/2023)   [employer] of Occupational Health - Occupational Stress Questionnaire    Feeling of Stress : Not at all  Social Connections: Moderately Integrated (03/21/2023)   Social Connection and Isolation Panel [NHANES]    Frequency of Communication with Friends and Family: More than three times a week    Frequency of Social Gatherings with Friends and Family: More than three times a week    Attends Religious Services: More than 4 times per year    Active Member of [employer] or Organizations: Yes    Attends Engineer, structural: More than 4 times per year    Marital Status: Never married     Review of Systems  Constitutional:  Negative for appetite change and unexpected weight change.  HENT:  Negative for congestion and sinus pressure.   Respiratory:  Negative for cough, chest tightness and shortness of breath.   Cardiovascular:  Negative for chest pain and palpitations.  Gastrointestinal:  Negative for abdominal pain, diarrhea, nausea and vomiting.  Genitourinary:  Negative for difficulty urinating and dysuria.  Musculoskeletal:  Negative for joint swelling and myalgias.  Skin:  Negative for color change and rash.  Neurological:  Negative for dizziness and headaches.  Psychiatric/Behavioral:  Negative for agitation and dysphoric mood.        Objective:     BP (!) 130/90   Pulse 88   Temp 98.2 F (36.8 C)   Resp 16   Ht 5\' 2"  (1.575 m)   Wt 171 lb 12.8 oz (77.9 kg)   SpO2 98%   BMI 31.42 kg/m  Wt Readings from Last 3 Encounters:  03/21/23 171 lb 12.8 oz (77.9 kg)  01/08/23 164 lb (74.4 kg)  04/11/17 173 lb 2 oz (78.5 kg)    Physical Exam Constitutional:      General: He is not in acute distress.    Appearance: Normal appearance. He is well-developed.  HENT:     Head:  Normocephalic and atraumatic.     Right Ear: External ear normal.     Left Ear: External ear normal.     Mouth/Throat:     Pharynx: No oropharyngeal exudate or posterior oropharyngeal erythema.  Eyes:     General: No scleral icterus.       Right eye: No discharge.        Left eye: No discharge.  Cardiovascular:     Rate and Rhythm: Normal rate and regular rhythm.  Pulmonary:     Effort: Pulmonary effort is normal. No respiratory distress.     Breath sounds: Normal breath sounds.  Abdominal:     General: Bowel sounds are normal.     Palpations: Abdomen is soft.     Tenderness: There is no abdominal tenderness.  Musculoskeletal:        General: No swelling or tenderness.     Cervical back: Neck supple. No tenderness.  Lymphadenopathy:     Cervical: No cervical adenopathy.  Skin:    Findings: No erythema or rash.  Neurological:     Mental Status: He is alert.  Psychiatric:        Mood and Affect: Mood normal.        Behavior: Behavior normal.         Outpatient Encounter Medications as of 03/21/2023  Medication Sig   valsartan (DIOVAN) 80 MG tablet Take 1 tablet (80 mg total) by mouth daily.   Multiple Vitamin (MULTIVITAMIN) tablet Take 1 tablet by mouth daily.   Triamcinolone Acetonide (NASACORT AQ NA) Place into the nose.   trimethoprim-polymyxin b (POLYTRIM) ophthalmic solution Place 1 drop into both eyes every 4 (four) hours.   No facility-administered encounter medications on file as of 03/21/2023.     Lab Results  Component Value Date   WBC 6.2 01/24/2023   HGB 15.1 01/24/2023   HCT 45.4 01/24/2023   PLT 280.0 01/24/2023   GLUCOSE 103 (H) 01/24/2023   CHOL 192 01/24/2023   TRIG 160.0 (H) 01/24/2023   HDL 47.30 01/24/2023   LDLCALC 113 (H) 01/24/2023   ALT 29 01/24/2023   AST 26 01/24/2023   NA 141 01/24/2023   K 3.6 01/24/2023   CL 106 01/24/2023   CREATININE 0.89 01/24/2023   BUN 7 01/24/2023   CO2 28 01/24/2023   TSH 1.44 01/24/2023        Assessment & Plan:  Hypertension, essential Assessment & Plan: Blood pressure remaining elevated as outlined. Discussed treatment options. Will start him on valsartan 80mg  daily. Follow pressures.  Schedule soon f/u to reassess blood pressure. Check metabolic panel in 10-14 days.   Orders: -     Basic  metabolic panel; Future  Encounter for screening for coronary artery disease Assessment & Plan: Discussed screening for CAD, given family history of early heart disease. Currently without symptoms.  Starting blood pressure medication.  Cholesterol as outlined. Even though 32, I feel he would benefit from screening calcium score.    Orders: -     CT CARDIAC SCORING (SELF PAY ONLY); Future  Hypercholesterolemia Assessment & Plan: Low cholesterol diet and exercise.  Follow lipid panel.    Other orders -     Valsartan; Take 1 tablet (80 mg total) by mouth daily.  Dispense: 30 tablet; Refill: 1     Dale Heron, MD

## 2023-03-24 ENCOUNTER — Encounter: Payer: Self-pay | Admitting: Internal Medicine

## 2023-03-24 DIAGNOSIS — Z136 Encounter for screening for cardiovascular disorders: Secondary | ICD-10-CM | POA: Insufficient documentation

## 2023-03-24 DIAGNOSIS — E7800 Pure hypercholesterolemia, unspecified: Secondary | ICD-10-CM | POA: Insufficient documentation

## 2023-03-24 NOTE — Assessment & Plan Note (Signed)
Blood pressure remaining elevated as outlined. Discussed treatment options. Will start him on valsartan 80mg  daily. Follow pressures.  Schedule soon f/u to reassess blood pressure. Check metabolic panel in 10-14 days.

## 2023-03-24 NOTE — Assessment & Plan Note (Signed)
Discussed screening for CAD, given family history of early heart disease. Currently without symptoms.  Starting blood pressure medication.  Cholesterol as outlined. Even though 32, I feel he would benefit from screening calcium score.

## 2023-03-24 NOTE — Assessment & Plan Note (Signed)
 Low cholesterol diet and exercise.  Follow lipid panel.

## 2023-04-01 ENCOUNTER — Other Ambulatory Visit (INDEPENDENT_AMBULATORY_CARE_PROVIDER_SITE_OTHER): Payer: PRIVATE HEALTH INSURANCE

## 2023-04-01 ENCOUNTER — Ambulatory Visit
Admission: RE | Admit: 2023-04-01 | Discharge: 2023-04-01 | Disposition: A | Discharge status: Home / Self Care | Payer: Self-pay | Source: Ambulatory Visit | Attending: Internal Medicine | Admitting: Internal Medicine

## 2023-04-01 DIAGNOSIS — I10 Essential (primary) hypertension: Secondary | ICD-10-CM | POA: Diagnosis not present

## 2023-04-01 DIAGNOSIS — Z136 Encounter for screening for cardiovascular disorders: Secondary | ICD-10-CM | POA: Insufficient documentation

## 2023-04-02 ENCOUNTER — Encounter: Payer: Self-pay | Admitting: Internal Medicine

## 2023-04-02 LAB — BASIC METABOLIC PANEL
BUN: 5 mg/dL — ABNORMAL LOW (ref 6–23)
CO2: 26 meq/L (ref 19–32)
Calcium: 9.6 mg/dL (ref 8.4–10.5)
Chloride: 102 meq/L (ref 96–112)
Creatinine, Ser: 0.92 mg/dL (ref 0.40–1.50)
GFR: 110.57 mL/min (ref >60.00)
Glucose, Bld: 80 mg/dL (ref 70–99)
Potassium: 3.6 meq/L (ref 3.5–5.1)
Sodium: 138 meq/L (ref 135–145)

## 2023-04-17 ENCOUNTER — Other Ambulatory Visit: Payer: Self-pay | Admitting: Internal Medicine

## 2023-05-02 ENCOUNTER — Encounter: Payer: Self-pay | Admitting: Internal Medicine

## 2023-05-02 ENCOUNTER — Ambulatory Visit (INDEPENDENT_AMBULATORY_CARE_PROVIDER_SITE_OTHER): Payer: PRIVATE HEALTH INSURANCE | Admitting: Internal Medicine

## 2023-05-02 VITALS — BP 130/90 | HR 77 | Temp 98.1°F | Ht 62.0 in | Wt 178.0 lb

## 2023-05-02 DIAGNOSIS — E7800 Pure hypercholesterolemia, unspecified: Secondary | ICD-10-CM | POA: Diagnosis not present

## 2023-05-02 DIAGNOSIS — I10 Essential (primary) hypertension: Secondary | ICD-10-CM

## 2023-05-02 DIAGNOSIS — R030 Elevated blood-pressure reading, without diagnosis of hypertension: Secondary | ICD-10-CM

## 2023-05-02 MED ORDER — VALSARTAN 160 MG PO TABS: 160.0000 mg | ORAL_TABLET | Freq: Every day | ORAL | 2 refills | Status: DC

## 2023-05-02 NOTE — Progress Notes (Signed)
 Subjective:    Patient ID: Derek Dillon, male    DOB: June 15, 1990, 33 y.o.   MRN: [id number]  Patient here for  Chief Complaint  Patient presents with   Hypertension    HPI Here for a scheduled follow up - follow up regarding his blood pressure. He is accompanied by his mother. History obtained from both of them. Started on valsartan last visit. Tolerating the medication. Blood pressure still remaining a little high - around 130 systolic range and upper 80 - 90 diastolic. Recent calcium score 0. No chest pain or sob reported. No abdominal pain or bowel cahnge reported.    Past Medical History:  Diagnosis Date   Allergy    History reviewed. No pertinent surgical history. Family History  Problem Relation Age of Onset   Hypertension Mother    Hypertension Father    Hypertension Maternal Grandfather    Social History   Socioeconomic History   Marital status: Single    Spouse name: Not on file   Number of children: Not on file   Years of education: Not on file   Highest education level: 12th grade  Occupational History   Not on file  Tobacco Use   Smoking status: Never   Smokeless tobacco: Never  Substance and Sexual Activity   Alcohol use: No    Alcohol/week: 0.0 standard drinks of alcohol   Drug use: No   Sexual activity: Not on file  Other Topics Concern   Not on file  Social History Narrative   Not on file   Social Drivers of Health   Financial Resource Strain: Low Risk  (03/21/2023)   Overall Financial Resource Strain (CARDIA)    Difficulty of Paying Living Expenses: Not hard at all  Food Insecurity: No Food Insecurity (03/21/2023)   Hunger Vital Sign    Worried About Running Out of Food in the Last Year: Never true    Ran Out of Food in the Last Year: Never true  Transportation Needs: No Transportation Needs (03/21/2023)   PRAPARE - Administrator, Civil Service (Medical): No    Lack of Transportation (Non-Medical): No  Physical Activity:  Unknown (03/21/2023)   Exercise Vital Sign    Days of Exercise per Week: 0 days    Minutes of Exercise per Session: Not on file  Stress: No Stress Concern Present (03/21/2023)   [employer] of Occupational Health - Occupational Stress Questionnaire    Feeling of Stress : Not at all  Social Connections: Moderately Integrated (03/21/2023)   Social Connection and Isolation Panel [NHANES]    Frequency of Communication with Friends and Family: More than three times a week    Frequency of Social Gatherings with Friends and Family: More than three times a week    Attends Religious Services: More than 4 times per year    Active Member of [employer] or Organizations: Yes    Attends Engineer, structural: More than 4 times per year    Marital Status: Never married     Review of Systems  Constitutional:  Negative for appetite change and unexpected weight change.  HENT:  Negative for congestion and sinus pressure.   Respiratory:  Negative for cough, chest tightness and shortness of breath.   Cardiovascular:  Negative for chest pain, palpitations and leg swelling.  Gastrointestinal:  Negative for abdominal pain, diarrhea, nausea and vomiting.  Genitourinary:  Negative for difficulty urinating and dysuria.  Musculoskeletal:  Negative for joint swelling and myalgias.  Skin:  Negative for color change and rash.  Neurological:  Negative for dizziness and headaches.  Psychiatric/Behavioral:  Negative for agitation and dysphoric mood.        Objective:     BP (!) 130/90   Pulse 77   Temp 98.1 F (36.7 C) (Oral)   Ht 5\' 2"  (1.575 m)   Wt 178 lb (80.7 kg)   SpO2 98%   BMI 32.56 kg/m  Wt Readings from Last 3 Encounters:  05/02/23 178 lb (80.7 kg)  03/21/23 171 lb 12.8 oz (77.9 kg)  01/08/23 164 lb (74.4 kg)    Physical Exam Vitals reviewed.  Constitutional:      General: He is not in acute distress.    Appearance: Normal appearance. He is well-developed.  HENT:     Head:  Normocephalic and atraumatic.     Right Ear: External ear normal.     Left Ear: External ear normal.     Mouth/Throat:     Pharynx: No oropharyngeal exudate or posterior oropharyngeal erythema.  Eyes:     General: No scleral icterus.       Right eye: No discharge.        Left eye: No discharge.     Conjunctiva/sclera: Conjunctivae normal.  Cardiovascular:     Rate and Rhythm: Normal rate and regular rhythm.  Pulmonary:     Effort: Pulmonary effort is normal. No respiratory distress.     Breath sounds: Normal breath sounds.  Abdominal:     General: Bowel sounds are normal.     Palpations: Abdomen is soft.     Tenderness: There is no abdominal tenderness.  Musculoskeletal:        General: No swelling or tenderness.     Cervical back: Neck supple. No tenderness.  Lymphadenopathy:     Cervical: No cervical adenopathy.  Skin:    Findings: No erythema or rash.  Neurological:     Mental Status: He is alert.  Psychiatric:        Mood and Affect: Mood normal.        Behavior: Behavior normal.         Outpatient Encounter Medications as of 05/02/2023  Medication Sig   Multiple Vitamin (MULTIVITAMIN) tablet Take 1 tablet by mouth daily.   valsartan (DIOVAN) 160 MG tablet Take 1 tablet (160 mg total) by mouth daily.   [DISCONTINUED] valsartan (DIOVAN) 80 MG tablet TAKE 1 TABLET BY MOUTH ONCE DAILY   [DISCONTINUED] Triamcinolone Acetonide (NASACORT AQ NA) Place into the nose.   [DISCONTINUED] trimethoprim-polymyxin b (POLYTRIM) ophthalmic solution Place 1 drop into both eyes every 4 (four) hours.   No facility-administered encounter medications on file as of 05/02/2023.     Lab Results  Component Value Date   WBC 6.2 01/24/2023   HGB 15.1 01/24/2023   HCT 45.4 01/24/2023   PLT 280.0 01/24/2023   GLUCOSE 80 04/01/2023   CHOL 192 01/24/2023   TRIG 160.0 (H) 01/24/2023   HDL 47.30 01/24/2023   LDLCALC 113 (H) 01/24/2023   ALT 29 01/24/2023   AST 26 01/24/2023   NA 138  04/01/2023   K 3.6 04/01/2023   CL 102 04/01/2023   CREATININE 0.92 04/01/2023   BUN 5 (L) 04/01/2023   CO2 26 04/01/2023   TSH 1.44 01/24/2023    CT CARDIAC SCORING (SELF PAY ONLY) Addendum Date: 04/07/2023 ADDENDUM REPORT: 04/07/2023 20:44 EXAM: OVER-READ INTERPRETATION CT CHEST The following report is an over-read performed by radiologist Dr. Romona Curls of  [facility] on 04/07/2023. This over-read does not include interpretation of cardiac or coronary anatomy or pathology. The coronary calcium score interpretation by the cardiologist is attached. COMPARISON:  Chest radiograph dated 09/02/2010. FINDINGS: Cardiovascular: No significant extracardiac vascular findings. Normal heart size. No pericardial effusion. Mediastinum/Nodes: No enlarged mediastinal lymph nodes. The visible trachea and esophagus demonstrate no significant findings. Lungs/Pleura: The visible lungs are clear. No pleural effusion. Upper Abdomen: No acute abnormality. Musculoskeletal: No chest wall mass or suspicious bone lesions identified. IMPRESSION: No significant extracardiac findings. Electronically Signed   By: Romona Curls M.D.   On: 04/07/2023 20:44   Result Date: 04/07/2023 CLINICAL DATA:  Risk stratification EXAM: Coronary Calcium Score TECHNIQUE: The patient was scanned on a Siemens Somatom scanner. Axial non-contrast 3 mm slices were carried out through the heart. The data set was analyzed on a dedicated work station and scored using the Agatson method. FINDINGS: Non-cardiac: See separate report from [facility]. Ascending Aorta: Normal size Pericardium: Normal Coronary arteries: Normal origin of left and right coronary arteries. Distribution of arterial calcifications if present, as noted below; LM 0 LAD 0 LCx 0 RCA 0 Total 0 IMPRESSION AND RECOMMENDATION: 1. Coronary calcium score of 0. 2. CAC 0, CAC-DRS A0. 3. Continue heart healthy lifestyle and risk factor modification. Electronically Signed: By:  Debbe Odea M.D. On: 04/01/2023 18:20       Assessment & Plan:  Hypercholesterolemia Assessment & Plan: Low cholesterol diet and exercise.  Follow lipid panel. Recent calcium score 0.    Elevated blood pressure reading -     TSH; Future -     Lipid panel; Future -     Basic metabolic panel; Future -     Hepatic function panel; Future -     CBC with Differential/Platelet; Future  Hypertension, essential Assessment & Plan: Blood pressure as outlined. Remaining above goal. Will increase diovan to 160mg  q day. Follow pressures.  Follow metabolic panel. Met b 04/01/23 wnl.   Orders: -     Basic metabolic panel; Future  Other orders -     Valsartan; Take 1 tablet (160 mg total) by mouth daily.  Dispense: 30 tablet; Refill: 2     Dale Hastings, MD

## 2023-05-12 ENCOUNTER — Encounter: Payer: Self-pay | Admitting: Internal Medicine

## 2023-05-12 NOTE — Assessment & Plan Note (Signed)
 Blood pressure as outlined. Remaining above goal. Will increase diovan to 160mg  q day. Follow pressures.  Follow metabolic panel. Met b 04/01/23 wnl.

## 2023-05-12 NOTE — Assessment & Plan Note (Signed)
 Low cholesterol diet and exercise.  Follow lipid panel. Recent calcium score 0.

## 2023-06-25 ENCOUNTER — Ambulatory Visit: Payer: PRIVATE HEALTH INSURANCE | Admitting: Internal Medicine

## 2023-08-14 ENCOUNTER — Other Ambulatory Visit: Payer: Self-pay | Admitting: Internal Medicine

## 2023-08-20 ENCOUNTER — Encounter: Payer: Self-pay | Admitting: Internal Medicine

## 2023-08-20 ENCOUNTER — Ambulatory Visit: Payer: PRIVATE HEALTH INSURANCE | Admitting: Internal Medicine

## 2023-08-20 VITALS — BP 136/82 | HR 69 | Temp 99.0°F | Ht 62.0 in | Wt 193.6 lb

## 2023-08-20 DIAGNOSIS — I10 Essential (primary) hypertension: Secondary | ICD-10-CM

## 2023-08-20 DIAGNOSIS — E7800 Pure hypercholesterolemia, unspecified: Secondary | ICD-10-CM

## 2023-08-20 DIAGNOSIS — R030 Elevated blood-pressure reading, without diagnosis of hypertension: Secondary | ICD-10-CM | POA: Diagnosis not present

## 2023-08-20 NOTE — Progress Notes (Signed)
 Subjective:    Patient ID: Derek Dillon, male    DOB: June 06, 1990, 33 y.o.   MRN: [id number]  Patient here for  Chief Complaint  Patient presents with   Medical Management of Chronic Issues   Hypertension    HPI Here for a scheduled follow up - follow up regarding his blood pressure. Diovan  increased to 160mg  q day. Reports he is doing relatively well. No chest pain or sob reported. No cough or congestion. No abdominal pain or bowel change. Blood pressure improved. Tolerating medication.    Past Medical History:  Diagnosis Date   Allergy    History reviewed. No pertinent surgical history. Family History  Problem Relation Age of Onset   Hypertension Mother    Hypertension Father    Hypertension Maternal Grandfather    Social History   Socioeconomic History   Marital status: Single    Spouse name: Not on file   Number of children: Not on file   Years of education: Not on file   Highest education level: 12th grade  Occupational History   Not on file  Tobacco Use   Smoking status: Never   Smokeless tobacco: Never  Substance and Sexual Activity   Alcohol use: No    Alcohol/week: 0.0 standard drinks of alcohol   Drug use: No   Sexual activity: Not on file  Other Topics Concern   Not on file  Social History Narrative   Not on file   Social Drivers of Health   Financial Resource Strain: Low Risk  (03/21/2023)   Overall Financial Resource Strain (CARDIA)    Difficulty of Paying Living Expenses: Not hard at all  Food Insecurity: No Food Insecurity (03/21/2023)   Hunger Vital Sign    Worried About Running Out of Food in the Last Year: Never true    Ran Out of Food in the Last Year: Never true  Transportation Needs: No Transportation Needs (03/21/2023)   PRAPARE - Administrator, Civil Service (Medical): No    Lack of Transportation (Non-Medical): No  Physical Activity: Unknown (03/21/2023)   Exercise Vital Sign    Days of Exercise per Week: 0 days     Minutes of Exercise per Session: Not on file  Stress: No Stress Concern Present (03/21/2023)   [employer] of Occupational Health - Occupational Stress Questionnaire    Feeling of Stress : Not at all  Social Connections: Moderately Integrated (03/21/2023)   Social Connection and Isolation Panel    Frequency of Communication with Friends and Family: More than three times a week    Frequency of Social Gatherings with Friends and Family: More than three times a week    Attends Religious Services: More than 4 times per year    Active Member of [employer] or Organizations: Yes    Attends Engineer, structural: More than 4 times per year    Marital Status: Never married     Review of Systems  Constitutional:  Negative for appetite change and unexpected weight change.  HENT:  Negative for congestion and sinus pressure.   Respiratory:  Negative for cough, chest tightness and shortness of breath.   Cardiovascular:  Negative for chest pain, palpitations and leg swelling.  Gastrointestinal:  Negative for abdominal pain, diarrhea, nausea and vomiting.  Genitourinary:  Negative for difficulty urinating and dysuria.  Musculoskeletal:  Negative for joint swelling and myalgias.  Skin:  Negative for color change and rash.  Neurological:  Negative for dizziness and  headaches.  Psychiatric/Behavioral:  Negative for agitation and dysphoric mood.        Objective:     BP 136/82   Pulse 69   Temp 99 F (37.2 C) (Oral)   Ht 5' 2 (1.575 m)   Wt 193 lb 9.6 oz (87.8 kg)   SpO2 96%   BMI 35.41 kg/m  Wt Readings from Last 3 Encounters:  08/20/23 193 lb 9.6 oz (87.8 kg)  05/02/23 178 lb (80.7 kg)  03/21/23 171 lb 12.8 oz (77.9 kg)    Physical Exam Vitals reviewed.  Constitutional:      General: He is not in acute distress.    Appearance: Normal appearance. He is well-developed.  HENT:     Head: Normocephalic and atraumatic.     Right Ear: External ear normal.     Left Ear: External  ear normal.     Mouth/Throat:     Pharynx: No oropharyngeal exudate or posterior oropharyngeal erythema.  Eyes:     General: No scleral icterus.       Right eye: No discharge.        Left eye: No discharge.     Conjunctiva/sclera: Conjunctivae normal.  Cardiovascular:     Rate and Rhythm: Normal rate and regular rhythm.  Pulmonary:     Effort: Pulmonary effort is normal. No respiratory distress.     Breath sounds: Normal breath sounds.  Abdominal:     General: Bowel sounds are normal.     Palpations: Abdomen is soft.     Tenderness: There is no abdominal tenderness.  Musculoskeletal:        General: No swelling or tenderness.     Cervical back: Neck supple. No tenderness.  Lymphadenopathy:     Cervical: No cervical adenopathy.  Skin:    Findings: No erythema or rash.  Neurological:     Mental Status: He is alert.  Psychiatric:        Mood and Affect: Mood normal.        Behavior: Behavior normal.         Outpatient Encounter Medications as of 08/20/2023  Medication Sig   Multiple Vitamin (MULTIVITAMIN) tablet Take 1 tablet by mouth daily.   valsartan  (DIOVAN ) 160 MG tablet TAKE ONE TABLET BY MOUTH DAILY   No facility-administered encounter medications on file as of 08/20/2023.     Lab Results  Component Value Date   WBC 10.0 08/20/2023   HGB 14.7 08/20/2023   HCT 43.5 08/20/2023   PLT 323.0 08/20/2023   GLUCOSE 91 08/20/2023   CHOL 186 08/20/2023   TRIG (H) 08/20/2023    421.0 Triglyceride is over 400; calculations on Lipids are invalid.   HDL 36.00 (L) 08/20/2023   LDLDIRECT 103.0 08/20/2023   LDLCALC 113 (H) 01/24/2023   ALT 57 (H) 08/20/2023   AST 33 08/20/2023   NA 139 08/20/2023   K 4.2 08/20/2023   CL 103 08/20/2023   CREATININE 1.02 08/20/2023   BUN 5 (L) 08/20/2023   CO2 27 08/20/2023   TSH 1.56 08/20/2023    CT CARDIAC SCORING (SELF PAY ONLY) Addendum Date: 04/07/2023 ADDENDUM REPORT: 04/07/2023 20:44 EXAM: OVER-READ INTERPRETATION CT CHEST The  following report is an over-read performed by radiologist Dr. Norman Hopper of [facility] on 04/07/2023. This over-read does not include interpretation of cardiac or coronary anatomy or pathology. The coronary calcium score interpretation by the cardiologist is attached. COMPARISON:  Chest radiograph dated 09/02/2010. FINDINGS: Cardiovascular: No significant extracardiac vascular findings.  Normal heart size. No pericardial effusion. Mediastinum/Nodes: No enlarged mediastinal lymph nodes. The visible trachea and esophagus demonstrate no significant findings. Lungs/Pleura: The visible lungs are clear. No pleural effusion. Upper Abdomen: No acute abnormality. Musculoskeletal: No chest wall mass or suspicious bone lesions identified. IMPRESSION: No significant extracardiac findings. Electronically Signed   By: Norman Hopper M.D.   On: 04/07/2023 20:44   Result Date: 04/07/2023 CLINICAL DATA:  Risk stratification EXAM: Coronary Calcium Score TECHNIQUE: The patient was scanned on a Siemens Somatom scanner. Axial non-contrast 3 mm slices were carried out through the heart. The data set was analyzed on a dedicated work station and scored using the Agatson method. FINDINGS: Non-cardiac: See separate report from [facility]. Ascending Aorta: Normal size Pericardium: Normal Coronary arteries: Normal origin of left and right coronary arteries. Distribution of arterial calcifications if present, as noted below; LM 0 LAD 0 LCx 0 RCA 0 Total 0 IMPRESSION AND RECOMMENDATION: 1. Coronary calcium score of 0. 2. CAC 0, CAC-DRS A0. 3. Continue heart healthy lifestyle and risk factor modification. Electronically Signed: By: Redell Cave M.D. On: 04/01/2023 18:20       Assessment & Plan:  Hypercholesterolemia Assessment & Plan: Low cholesterol diet and exercise. Recent calcium score 0. Follow lipid panel.    Hypertension, essential Assessment & Plan: Blood pressure as outlined. Continue diovan   160mg  q day. No changes in medication today. Follow.   Orders: -     Basic metabolic panel with GFR  Elevated blood pressure reading -     CBC with Differential/Platelet -     Hepatic function panel -     Basic metabolic panel with GFR -     Lipid panel -     TSH  Other orders -     LDL cholesterol, direct     Allena Hamilton, MD

## 2023-08-21 LAB — CBC WITH DIFFERENTIAL/PLATELET
Basophils Absolute: 0.1 10*3/uL (ref 0.0–0.1)
Basophils Relative: 1.1 % (ref 0.0–3.0)
Eosinophils Absolute: 0.4 10*3/uL (ref 0.0–0.7)
Eosinophils Relative: 4.1 % (ref 0.0–5.0)
HCT: 43.5 % (ref 39.0–52.0)
Hemoglobin: 14.7 g/dL (ref 13.0–17.0)
Lymphocytes Relative: 33.3 % (ref 12.0–46.0)
Lymphs Abs: 3.3 10*3/uL (ref 0.7–4.0)
MCHC: 33.9 g/dL (ref 30.0–36.0)
MCV: 86.9 fl (ref 78.0–100.0)
Monocytes Absolute: 1.1 10*3/uL — ABNORMAL HIGH (ref 0.1–1.0)
Monocytes Relative: 11.2 % (ref 3.0–12.0)
Neutro Abs: 5 10*3/uL (ref 1.4–7.7)
Neutrophils Relative %: 50.3 % (ref 43.0–77.0)
Platelets: 323 10*3/uL (ref 150.0–400.0)
RBC: 5 Mil/uL (ref 4.22–5.81)
RDW: 13.2 % (ref 11.5–15.5)
WBC: 10 10*3/uL (ref 4.0–10.5)

## 2023-08-21 LAB — BASIC METABOLIC PANEL WITH GFR
BUN: 5 mg/dL — ABNORMAL LOW (ref 6–23)
CO2: 27 meq/L (ref 19–32)
Calcium: 9.9 mg/dL (ref 8.4–10.5)
Chloride: 103 meq/L (ref 96–112)
Creatinine, Ser: 1.02 mg/dL (ref 0.40–1.50)
GFR: 97.42 mL/min (ref >60.00)
Glucose, Bld: 91 mg/dL (ref 70–99)
Potassium: 4.2 meq/L (ref 3.5–5.1)
Sodium: 139 meq/L (ref 135–145)

## 2023-08-21 LAB — HEPATIC FUNCTION PANEL
ALT: 57 U/L — ABNORMAL HIGH (ref 0–53)
AST: 33 U/L (ref 0–37)
Albumin: 4.8 g/dL (ref 3.5–5.2)
Alkaline Phosphatase: 106 U/L (ref 39–117)
Bilirubin, Direct: 0.1 mg/dL (ref 0.0–0.3)
Total Bilirubin: 0.4 mg/dL (ref 0.2–1.2)
Total Protein: 7.9 g/dL (ref 6.0–8.3)

## 2023-08-21 LAB — LIPID PANEL
Cholesterol: 186 mg/dL (ref 0–200)
HDL: 36 mg/dL — ABNORMAL LOW (ref >39.00)
NonHDL: 150.15
Total CHOL/HDL Ratio: 5
Triglycerides: 421 mg/dL — ABNORMAL HIGH (ref 0.0–149.0)
VLDL: 84.2 mg/dL — ABNORMAL HIGH (ref 0.0–40.0)

## 2023-08-21 LAB — LDL CHOLESTEROL, DIRECT: Direct LDL: 103 mg/dL

## 2023-08-21 LAB — TSH: TSH: 1.56 u[IU]/mL (ref 0.35–5.50)

## 2023-08-22 ENCOUNTER — Ambulatory Visit: Payer: Self-pay | Admitting: Internal Medicine

## 2023-08-22 DIAGNOSIS — R030 Elevated blood-pressure reading, without diagnosis of hypertension: Secondary | ICD-10-CM

## 2023-08-25 ENCOUNTER — Encounter: Payer: Self-pay | Admitting: Internal Medicine

## 2023-08-25 NOTE — Assessment & Plan Note (Signed)
 Blood pressure as outlined. Continue diovan  160mg  q day. No changes in medication today. Follow.

## 2023-08-25 NOTE — Assessment & Plan Note (Signed)
 Low cholesterol diet and exercise. Recent calcium score 0. Follow lipid panel.

## 2023-09-11 ENCOUNTER — Other Ambulatory Visit (INDEPENDENT_AMBULATORY_CARE_PROVIDER_SITE_OTHER): Payer: PRIVATE HEALTH INSURANCE

## 2023-09-11 DIAGNOSIS — R030 Elevated blood-pressure reading, without diagnosis of hypertension: Secondary | ICD-10-CM

## 2023-09-12 ENCOUNTER — Ambulatory Visit: Payer: Self-pay | Admitting: Internal Medicine

## 2023-09-12 LAB — HEPATIC FUNCTION PANEL
ALT: 48 U/L (ref 0–53)
AST: 30 U/L (ref 0–37)
Albumin: 4.7 g/dL (ref 3.5–5.2)
Alkaline Phosphatase: 113 U/L (ref 39–117)
Bilirubin, Direct: 0.1 mg/dL (ref 0.0–0.3)
Total Bilirubin: 0.4 mg/dL (ref 0.2–1.2)
Total Protein: 7.8 g/dL (ref 6.0–8.3)

## 2023-09-13 NOTE — Telephone Encounter (Signed)
 Please call and confirm doing ok. Given variation, agree with continuing to spot check pressure and send in readings. Any problems or concerns let us  know.

## 2023-09-17 NOTE — Telephone Encounter (Signed)
 Mom is aware. Confirmed no acute symptoms. Will continue to monitor.

## 2023-11-21 ENCOUNTER — Ambulatory Visit: Payer: PRIVATE HEALTH INSURANCE | Admitting: Internal Medicine

## 2023-11-21 VITALS — BP 128/80 | HR 95 | Resp 16 | Ht 62.0 in | Wt 198.0 lb

## 2023-11-21 DIAGNOSIS — E7800 Pure hypercholesterolemia, unspecified: Secondary | ICD-10-CM

## 2023-11-21 DIAGNOSIS — I10 Essential (primary) hypertension: Secondary | ICD-10-CM

## 2023-11-21 DIAGNOSIS — F439 Reaction to severe stress, unspecified: Secondary | ICD-10-CM

## 2023-11-21 NOTE — Progress Notes (Signed)
 Subjective:    Patient ID: Derek Dillon, male    DOB: 02-03-1991, 33 y.o.   MRN: [id number]  Patient here for  Chief Complaint  Patient presents with   Medical Management of Chronic Issues    HPI Here for a scheduled follow up - follow up regarding his blood pressure. Current on diovan  160mg  q day. Recent calcium score 0. He is accompanied by his mother. History obtained from both of them. Overall he feels he is doing relatively well. No chest pain or sob reported. No abdominal pain or bowel change reported. Some increased stress. Looking for a job.    Past Medical History:  Diagnosis Date   Allergy    History reviewed. No pertinent surgical history. Family History  Problem Relation Age of Onset   Hypertension Mother    Hypertension Father    Hypertension Maternal Grandfather    Social History   Socioeconomic History   Marital status: Single    Spouse name: Not on file   Number of children: Not on file   Years of education: Not on file   Highest education level: Some college, no degree  Occupational History   Not on file  Tobacco Use   Smoking status: Never   Smokeless tobacco: Never  Substance and Sexual Activity   Alcohol use: No    Alcohol/week: 0.0 standard drinks of alcohol   Drug use: No   Sexual activity: Not on file  Other Topics Concern   Not on file  Social History Narrative   Not on file   Social Drivers of Health   Financial Resource Strain: Low Risk  (11/19/2023)   Overall Financial Resource Strain (CARDIA)    Difficulty of Paying Living Expenses: Not very hard  Food Insecurity: No Food Insecurity (11/19/2023)   Hunger Vital Sign    Worried About Running Out of Food in the Last Year: Never true    Ran Out of Food in the Last Year: Never true  Transportation Needs: No Transportation Needs (11/19/2023)   PRAPARE - Administrator, Civil Service (Medical): No    Lack of Transportation (Non-Medical): No  Physical Activity: Inactive  (11/19/2023)   Exercise Vital Sign    Days of Exercise per Week: 0 days    Minutes of Exercise per Session: Not on file  Stress: No Stress Concern Present (11/19/2023)   [employer] of Occupational Health - Occupational Stress Questionnaire    Feeling of Stress: Not at all  Social Connections: Moderately Integrated (11/19/2023)   Social Connection and Isolation Panel    Frequency of Communication with Friends and Family: More than three times a week    Frequency of Social Gatherings with Friends and Family: More than three times a week    Attends Religious Services: More than 4 times per year    Active Member of [employer] or Organizations: Yes    Attends Engineer, structural: More than 4 times per year    Marital Status: Never married     Review of Systems  Constitutional:  Negative for appetite change and unexpected weight change.  HENT:  Negative for congestion and sinus pressure.   Respiratory:  Negative for cough, chest tightness and shortness of breath.   Cardiovascular:  Negative for chest pain, palpitations and leg swelling.  Gastrointestinal:  Negative for abdominal pain, diarrhea, nausea and vomiting.  Genitourinary:  Negative for difficulty urinating and dysuria.  Musculoskeletal:  Negative for joint swelling and myalgias.  Skin:  Negative for color change and rash.  Neurological:  Negative for dizziness and headaches.  Psychiatric/Behavioral:  Negative for agitation and dysphoric mood.        Objective:     BP 128/80   Pulse 95   Resp 16   Ht 5' 2 (1.575 m)   Wt 198 lb (89.8 kg)   SpO2 99%   BMI 36.21 kg/m  Wt Readings from Last 3 Encounters:  11/21/23 198 lb (89.8 kg)  08/20/23 193 lb 9.6 oz (87.8 kg)  05/02/23 178 lb (80.7 kg)    Physical Exam Vitals reviewed.  Constitutional:      General: He is not in acute distress.    Appearance: Normal appearance. He is well-developed.  HENT:     Head: Normocephalic and atraumatic.     Right Ear:  External ear normal.     Left Ear: External ear normal.     Mouth/Throat:     Pharynx: No oropharyngeal exudate or posterior oropharyngeal erythema.  Eyes:     General: No scleral icterus.       Right eye: No discharge.        Left eye: No discharge.     Conjunctiva/sclera: Conjunctivae normal.  Cardiovascular:     Rate and Rhythm: Normal rate and regular rhythm.  Pulmonary:     Effort: Pulmonary effort is normal. No respiratory distress.     Breath sounds: Normal breath sounds.  Abdominal:     General: Bowel sounds are normal.     Palpations: Abdomen is soft.     Tenderness: There is no abdominal tenderness.  Musculoskeletal:        General: No swelling or tenderness.     Cervical back: Neck supple. No tenderness.  Lymphadenopathy:     Cervical: No cervical adenopathy.  Skin:    Findings: No erythema or rash.  Neurological:     Mental Status: He is alert.  Psychiatric:        Mood and Affect: Mood normal.        Behavior: Behavior normal.         Outpatient Encounter Medications as of 11/21/2023  Medication Sig   Multiple Vitamin (MULTIVITAMIN) tablet Take 1 tablet by mouth daily.   valsartan  (DIOVAN ) 160 MG tablet TAKE ONE TABLET BY MOUTH DAILY   No facility-administered encounter medications on file as of 11/21/2023.     Lab Results  Component Value Date   WBC 10.0 08/20/2023   HGB 14.7 08/20/2023   HCT 43.5 08/20/2023   PLT 323.0 08/20/2023   GLUCOSE 91 08/20/2023   CHOL 186 08/20/2023   TRIG (H) 08/20/2023    421.0 Triglyceride is over 400; calculations on Lipids are invalid.   HDL 36.00 (L) 08/20/2023   LDLDIRECT 103.0 08/20/2023   LDLCALC 113 (H) 01/24/2023   ALT 48 09/11/2023   AST 30 09/11/2023   NA 139 08/20/2023   K 4.2 08/20/2023   CL 103 08/20/2023   CREATININE 1.02 08/20/2023   BUN 5 (L) 08/20/2023   CO2 27 08/20/2023   TSH 1.56 08/20/2023    CT CARDIAC SCORING (SELF PAY ONLY) Addendum Date: 04/07/2023 ADDENDUM REPORT: 04/07/2023 20:44  EXAM: OVER-READ INTERPRETATION CT CHEST The following report is an over-read performed by radiologist Dr. Norman Hopper of [facility] on 04/07/2023. This over-read does not include interpretation of cardiac or coronary anatomy or pathology. The coronary calcium score interpretation by the cardiologist is attached. COMPARISON:  Chest radiograph dated 09/02/2010. FINDINGS: Cardiovascular: No  significant extracardiac vascular findings. Normal heart size. No pericardial effusion. Mediastinum/Nodes: No enlarged mediastinal lymph nodes. The visible trachea and esophagus demonstrate no significant findings. Lungs/Pleura: The visible lungs are clear. No pleural effusion. Upper Abdomen: No acute abnormality. Musculoskeletal: No chest wall mass or suspicious bone lesions identified. IMPRESSION: No significant extracardiac findings. Electronically Signed   By: Norman Hopper M.D.   On: 04/07/2023 20:44   Result Date: 04/07/2023 CLINICAL DATA:  Risk stratification EXAM: Coronary Calcium Score TECHNIQUE: The patient was scanned on a Siemens Somatom scanner. Axial non-contrast 3 mm slices were carried out through the heart. The data set was analyzed on a dedicated work station and scored using the Agatson method. FINDINGS: Non-cardiac: See separate report from [facility]. Ascending Aorta: Normal size Pericardium: Normal Coronary arteries: Normal origin of left and right coronary arteries. Distribution of arterial calcifications if present, as noted below; LM 0 LAD 0 LCx 0 RCA 0 Total 0 IMPRESSION AND RECOMMENDATION: 1. Coronary calcium score of 0. 2. CAC 0, CAC-DRS A0. 3. Continue heart healthy lifestyle and risk factor modification. Electronically Signed: By: Redell Cave M.D. On: 04/01/2023 18:20       Assessment & Plan:  Hypercholesterolemia Assessment & Plan: Low cholesterol diet and exercise. Recent calcium score 0. Follow lipid panel.   Orders: -     Hepatic function panel; Future -      Lipid panel; Future  Hypertension, essential Assessment & Plan: Blood pressure as outlined. Continue diovan  160mg  q day. No changes in medication today. Have them spot check his pressure. Follow metabolic panel.   Orders: -     Basic metabolic panel with GFR; Future  Stress Assessment & Plan: Some increased stress recently. Looking for a job. Overall appears to be handling things relatively well. Follow.       Allena Hamilton, MD

## 2023-11-24 ENCOUNTER — Encounter: Payer: Self-pay | Admitting: Internal Medicine

## 2023-11-24 DIAGNOSIS — F439 Reaction to severe stress, unspecified: Secondary | ICD-10-CM | POA: Insufficient documentation

## 2023-11-24 NOTE — Assessment & Plan Note (Signed)
 Some increased stress recently. Looking for a job. Overall appears to be handling things relatively well. Follow.

## 2023-11-24 NOTE — Assessment & Plan Note (Signed)
 Low cholesterol diet and exercise. Recent calcium score 0. Follow lipid panel.

## 2023-11-24 NOTE — Assessment & Plan Note (Signed)
 Blood pressure as outlined. Continue diovan  160mg  q day. No changes in medication today. Have them spot check his pressure. Follow metabolic panel.

## 2023-11-26 ENCOUNTER — Other Ambulatory Visit: Payer: Self-pay | Admitting: Internal Medicine

## 2023-12-05 ENCOUNTER — Other Ambulatory Visit: Payer: Self-pay

## 2023-12-06 ENCOUNTER — Other Ambulatory Visit: Payer: Self-pay

## 2023-12-23 ENCOUNTER — Other Ambulatory Visit: Payer: Self-pay

## 2023-12-23 DIAGNOSIS — I10 Essential (primary) hypertension: Secondary | ICD-10-CM

## 2023-12-23 DIAGNOSIS — E7800 Pure hypercholesterolemia, unspecified: Secondary | ICD-10-CM

## 2023-12-23 LAB — BASIC METABOLIC PANEL WITH GFR
BUN: 7 mg/dL (ref 6–23)
CO2: 26 meq/L (ref 19–32)
Calcium: 9.3 mg/dL (ref 8.4–10.5)
Chloride: 103 meq/L (ref 96–112)
Creatinine, Ser: 0.97 mg/dL (ref 0.40–1.50)
GFR: 103.23 mL/min (ref >60.00)
Glucose, Bld: 98 mg/dL (ref 70–99)
Potassium: 3.5 meq/L (ref 3.5–5.1)
Sodium: 138 meq/L (ref 135–145)

## 2023-12-23 LAB — LIPID PANEL
Cholesterol: 164 mg/dL (ref 0–200)
HDL: 33.3 mg/dL — ABNORMAL LOW (ref >39.00)
NonHDL: 130.42
Total CHOL/HDL Ratio: 5
Triglycerides: 447 mg/dL — ABNORMAL HIGH (ref 0.0–149.0)
VLDL: 89.4 mg/dL — ABNORMAL HIGH (ref 0.0–40.0)

## 2023-12-23 LAB — HEPATIC FUNCTION PANEL
ALT: 45 U/L (ref 0–53)
AST: 30 U/L (ref 0–37)
Albumin: 4.4 g/dL (ref 3.5–5.2)
Alkaline Phosphatase: 118 U/L — ABNORMAL HIGH (ref 39–117)
Bilirubin, Direct: 0.1 mg/dL (ref 0.0–0.3)
Total Bilirubin: 0.3 mg/dL (ref 0.2–1.2)
Total Protein: 7.6 g/dL (ref 6.0–8.3)

## 2023-12-23 LAB — LDL CHOLESTEROL, DIRECT: Direct LDL: 80 mg/dL

## 2023-12-24 ENCOUNTER — Ambulatory Visit: Payer: Self-pay | Admitting: Internal Medicine

## 2023-12-24 DIAGNOSIS — E7800 Pure hypercholesterolemia, unspecified: Secondary | ICD-10-CM

## 2023-12-24 DIAGNOSIS — R748 Abnormal levels of other serum enzymes: Secondary | ICD-10-CM

## 2023-12-30 ENCOUNTER — Other Ambulatory Visit: Payer: Self-pay | Admitting: Internal Medicine

## 2024-01-13 ENCOUNTER — Other Ambulatory Visit (INDEPENDENT_AMBULATORY_CARE_PROVIDER_SITE_OTHER): Payer: Self-pay

## 2024-01-13 DIAGNOSIS — R748 Abnormal levels of other serum enzymes: Secondary | ICD-10-CM

## 2024-01-13 DIAGNOSIS — E7800 Pure hypercholesterolemia, unspecified: Secondary | ICD-10-CM

## 2024-01-13 LAB — HEPATIC FUNCTION PANEL
ALT: 36 U/L (ref 0–53)
AST: 25 U/L (ref 0–37)
Albumin: 4.3 g/dL (ref 3.5–5.2)
Alkaline Phosphatase: 116 U/L (ref 39–117)
Bilirubin, Direct: 0.1 mg/dL (ref 0.0–0.3)
Total Bilirubin: 0.4 mg/dL (ref 0.2–1.2)
Total Protein: 7.3 g/dL (ref 6.0–8.3)

## 2024-01-13 LAB — GAMMA GT: GGT: 45 U/L (ref 7–51)

## 2024-01-14 ENCOUNTER — Ambulatory Visit: Payer: Self-pay | Admitting: Internal Medicine

## 2024-01-28 ENCOUNTER — Other Ambulatory Visit: Payer: Self-pay | Admitting: Internal Medicine

## 2024-03-26 ENCOUNTER — Encounter: Payer: Self-pay | Admitting: Internal Medicine

## 2024-03-26 ENCOUNTER — Ambulatory Visit: Payer: Self-pay | Admitting: Internal Medicine

## 2024-03-26 DIAGNOSIS — I10 Essential (primary) hypertension: Secondary | ICD-10-CM

## 2024-03-26 DIAGNOSIS — E7800 Pure hypercholesterolemia, unspecified: Secondary | ICD-10-CM

## 2024-03-26 LAB — LIPID PANEL
Cholesterol: 169 mg/dL (ref 28–200)
HDL: 33.8 mg/dL — ABNORMAL LOW
NonHDL: 135.08
Total CHOL/HDL Ratio: 5
Triglycerides: 408 mg/dL — ABNORMAL HIGH (ref 10.0–149.0)
VLDL: 81.6 mg/dL — ABNORMAL HIGH (ref 0.0–40.0)

## 2024-03-26 LAB — HEPATIC FUNCTION PANEL
ALT: 40 U/L (ref 3–53)
AST: 27 U/L (ref 5–37)
Albumin: 4.4 g/dL (ref 3.5–5.2)
Alkaline Phosphatase: 116 U/L (ref 39–117)
Bilirubin, Direct: 0.1 mg/dL (ref 0.1–0.3)
Total Bilirubin: 0.4 mg/dL (ref 0.2–1.2)
Total Protein: 7.7 g/dL (ref 6.0–8.3)

## 2024-03-26 LAB — BASIC METABOLIC PANEL WITH GFR
BUN: 5 mg/dL — ABNORMAL LOW (ref 6–23)
CO2: 29 meq/L (ref 19–32)
Calcium: 9.7 mg/dL (ref 8.4–10.5)
Chloride: 102 meq/L (ref 96–112)
Creatinine, Ser: 1.06 mg/dL (ref 0.40–1.50)
GFR: 92.64 mL/min
Glucose, Bld: 94 mg/dL (ref 70–99)
Potassium: 3.8 meq/L (ref 3.5–5.1)
Sodium: 138 meq/L (ref 135–145)

## 2024-03-26 LAB — LDL CHOLESTEROL, DIRECT: Direct LDL: 95 mg/dL

## 2024-03-26 MED ORDER — MUPIROCIN 2 % EX OINT: 1.0000 | TOPICAL_OINTMENT | Freq: Two times a day (BID) | CUTANEOUS | 0 refills | Status: AC

## 2024-03-26 MED ORDER — VALSARTAN 160 MG PO TABS: 160.0000 mg | ORAL_TABLET | Freq: Every day | ORAL | 1 refills | Status: AC

## 2024-03-26 NOTE — Progress Notes (Unsigned)
 "  Subjective:    Patient ID: Derek Dillon, male    DOB: 04-09-1990, 34 y.o.   MRN: [id number]  Patient here for  Chief Complaint  Patient presents with   Medical Management of Chronic Issues    HPI Here for a scheduled follow up - follow up regarding hypertension and hypercholesterolemia. Continues on diovan .    Past Medical History:  Diagnosis Date   Allergy    History reviewed. No pertinent surgical history. Family History  Problem Relation Age of Onset   Hypertension Mother    Hypertension Father    Hypertension Maternal Grandfather    Social History   Socioeconomic History   Marital status: Single    Spouse name: Not on file   Number of children: Not on file   Years of education: Not on file   Highest education level: 12th grade  Occupational History   Not on file  Tobacco Use   Smoking status: Never   Smokeless tobacco: Never  Substance and Sexual Activity   Alcohol use: No    Alcohol/week: 0.0 standard drinks of alcohol   Drug use: No   Sexual activity: Not on file  Other Topics Concern   Not on file  Social History Narrative   Not on file   Social Drivers of Health   Tobacco Use: Low Risk (03/26/2024)   Patient History    Smoking Tobacco Use: Never    Smokeless Tobacco Use: Never    Passive Exposure: Not on file  Financial Resource Strain: Low Risk (03/25/2024)   Overall Financial Resource Strain (CARDIA)    Difficulty of Paying Living Expenses: Not hard at all  Food Insecurity: No Food Insecurity (03/25/2024)   Epic    Worried About Radiation Protection Practitioner of Food in the Last Year: Never true    Ran Out of Food in the Last Year: Never true  Transportation Needs: No Transportation Needs (03/25/2024)   Epic    Lack of Transportation (Medical): No    Lack of Transportation (Non-Medical): No  Physical Activity: Inactive (03/25/2024)   Exercise Vital Sign    Days of Exercise per Week: 0 days    Minutes of Exercise per Session: Not on file  Stress: No Stress  Concern Present (03/25/2024)   [employer] of Occupational Health - Occupational Stress Questionnaire    Feeling of Stress: Not at all  Social Connections: Moderately Integrated (03/25/2024)   Social Connection and Isolation Panel    Frequency of Communication with Friends and Family: More than three times a week    Frequency of Social Gatherings with Friends and Family: More than three times a week    Attends Religious Services: More than 4 times per year    Active Member of [employer] or Organizations: Yes    Attends Banker Meetings: Not on file    Marital Status: Never married  Depression (PHQ2-9): Low Risk (03/26/2024)   Depression (PHQ2-9)    PHQ-2 Score: 0  Alcohol Screen: Not on file  Housing: Low Risk (03/25/2024)   Epic    Unable to Pay for Housing in the Last Year: No    Number of Times Moved in the Last Year: 0    Homeless in the Last Year: No  Utilities: Not on file  Health Literacy: Not on file     Review of Systems     Objective:     BP 126/80   Pulse 75   Temp 98.4 F (36.9 C) (Oral)  Ht 5' 2 (1.575 m)   Wt 193 lb (87.5 kg)   SpO2 98%   BMI 35.30 kg/m  Wt Readings from Last 3 Encounters:  03/26/24 193 lb (87.5 kg)  11/21/23 198 lb (89.8 kg)  08/20/23 193 lb 9.6 oz (87.8 kg)    Physical Exam  {Perform Simple Foot Exam  Perform Detailed exam:1} {Insert foot Exam (Optional):30965}   Outpatient Encounter Medications as of 03/26/2024  Medication Sig   Multiple Vitamin (MULTIVITAMIN) tablet Take 1 tablet by mouth daily.   valsartan  (DIOVAN ) 160 MG tablet TAKE 1 TABLET BY MOUTH DAILY   No facility-administered encounter medications on file as of 03/26/2024.     Lab Results  Component Value Date   WBC 10.0 08/20/2023   HGB 14.7 08/20/2023   HCT 43.5 08/20/2023   PLT 323.0 08/20/2023   GLUCOSE 98 12/23/2023   CHOL 164 12/23/2023   TRIG (H) 12/23/2023    447.0 Triglyceride is over 400; calculations on Lipids are invalid.   HDL 33.30 (L)  12/23/2023   LDLDIRECT 80.0 12/23/2023   LDLCALC 113 (H) 01/24/2023   ALT 36 01/13/2024   AST 25 01/13/2024   NA 138 12/23/2023   K 3.5 12/23/2023   CL 103 12/23/2023   CREATININE 0.97 12/23/2023   BUN 7 12/23/2023   CO2 26 12/23/2023   TSH 1.56 08/20/2023    CT CARDIAC SCORING (SELF PAY ONLY) Addendum Date: 04/07/2023 ADDENDUM REPORT: 04/07/2023 20:44 EXAM: OVER-READ INTERPRETATION CT CHEST The following report is an over-read performed by radiologist Dr. Norman Hopper of [facility] on 04/07/2023. This over-read does not include interpretation of cardiac or coronary anatomy or pathology. The coronary calcium score interpretation by the cardiologist is attached. COMPARISON:  Chest radiograph dated 09/02/2010. FINDINGS: Cardiovascular: No significant extracardiac vascular findings. Normal heart size. No pericardial effusion. Mediastinum/Nodes: No enlarged mediastinal lymph nodes. The visible trachea and esophagus demonstrate no significant findings. Lungs/Pleura: The visible lungs are clear. No pleural effusion. Upper Abdomen: No acute abnormality. Musculoskeletal: No chest wall mass or suspicious bone lesions identified. IMPRESSION: No significant extracardiac findings. Electronically Signed   By: Norman Hopper M.D.   On: 04/07/2023 20:44   Result Date: 04/07/2023 CLINICAL DATA:  Risk stratification EXAM: Coronary Calcium Score TECHNIQUE: The patient was scanned on a Siemens Somatom scanner. Axial non-contrast 3 mm slices were carried out through the heart. The data set was analyzed on a dedicated work station and scored using the Agatson method. FINDINGS: Non-cardiac: See separate report from [facility]. Ascending Aorta: Normal size Pericardium: Normal Coronary arteries: Normal origin of left and right coronary arteries. Distribution of arterial calcifications if present, as noted below; LM 0 LAD 0 LCx 0 RCA 0 Total 0 IMPRESSION AND RECOMMENDATION: 1. Coronary calcium score of  0. 2. CAC 0, CAC-DRS A0. 3. Continue heart healthy lifestyle and risk factor modification. Electronically Signed: By: Redell Cave M.D. On: 04/01/2023 18:20       Assessment & Plan:  Hypertension, essential  Hypercholesterolemia     Allena Hamilton, MD "

## 2024-07-30 ENCOUNTER — Encounter: Payer: Self-pay | Admitting: Internal Medicine
